# Patient Record
Sex: Female | Born: 1960 | Race: White | Hispanic: No | Marital: Married | Smoking: Former smoker
Health system: Southern US, Community
[De-identification: ages and names within clinical notes are randomized; demographics above are authoritative.]

## PROBLEM LIST (undated history)

## (undated) DIAGNOSIS — D071 Carcinoma in situ of vulva: Secondary | ICD-10-CM

## (undated) DIAGNOSIS — N920 Excessive and frequent menstruation with regular cycle: Secondary | ICD-10-CM

## (undated) DIAGNOSIS — A630 Anogenital (venereal) warts: Secondary | ICD-10-CM

## (undated) DIAGNOSIS — N946 Dysmenorrhea, unspecified: Secondary | ICD-10-CM

## (undated) DIAGNOSIS — E756 Lipid storage disorder, unspecified: Secondary | ICD-10-CM

## (undated) DIAGNOSIS — E785 Hyperlipidemia, unspecified: Secondary | ICD-10-CM

## (undated) DIAGNOSIS — R638 Other symptoms and signs concerning food and fluid intake: Secondary | ICD-10-CM

## (undated) DIAGNOSIS — R7303 Prediabetes: Secondary | ICD-10-CM

## (undated) DIAGNOSIS — N6019 Diffuse cystic mastopathy of unspecified breast: Secondary | ICD-10-CM

## (undated) DIAGNOSIS — Z780 Asymptomatic menopausal state: Secondary | ICD-10-CM

## (undated) DIAGNOSIS — E8809 Other disorders of plasma-protein metabolism, not elsewhere classified: Secondary | ICD-10-CM

## (undated) DIAGNOSIS — R87629 Unspecified abnormal cytological findings in specimens from vagina: Secondary | ICD-10-CM

## (undated) DIAGNOSIS — M069 Rheumatoid arthritis, unspecified: Secondary | ICD-10-CM

## (undated) HISTORY — DX: Excessive and frequent menstruation with regular cycle: N92.0

## (undated) HISTORY — DX: Anogenital (venereal) warts: A63.0

## (undated) HISTORY — DX: Prediabetes: R73.03

## (undated) HISTORY — DX: Rheumatoid arthritis, unspecified: M06.9

## (undated) HISTORY — DX: Diffuse cystic mastopathy of unspecified breast: N60.19

## (undated) HISTORY — PX: OTHER SURGICAL HISTORY: SHX169

## (undated) HISTORY — DX: Carcinoma in situ of vulva: D07.1

## (undated) HISTORY — DX: Other symptoms and signs concerning food and fluid intake: R63.8

## (undated) HISTORY — DX: Unspecified abnormal cytological findings in specimens from vagina: R87.629

## (undated) HISTORY — DX: Other disorders of plasma-protein metabolism, not elsewhere classified: E88.09

## (undated) HISTORY — DX: Dysmenorrhea, unspecified: N94.6

## (undated) HISTORY — DX: Lipid storage disorder, unspecified: E75.6

## (undated) HISTORY — DX: Hyperlipidemia, unspecified: E78.5

## (undated) HISTORY — DX: Asymptomatic menopausal state: Z78.0

## (undated) HISTORY — PX: BREAST CYST ASPIRATION: SHX578

---

## 1985-01-02 HISTORY — PX: TONSILLECTOMY: SUR1361

## 2011-01-19 ENCOUNTER — Ambulatory Visit: Payer: Self-pay | Admitting: Family Medicine

## 2013-06-17 DIAGNOSIS — M069 Rheumatoid arthritis, unspecified: Secondary | ICD-10-CM | POA: Insufficient documentation

## 2013-07-26 LAB — HM PAP SMEAR: HM Pap smear: NEGATIVE

## 2013-08-12 ENCOUNTER — Ambulatory Visit: Payer: Self-pay | Admitting: Gynecologic Oncology

## 2013-08-16 DIAGNOSIS — D071 Carcinoma in situ of vulva: Secondary | ICD-10-CM

## 2013-08-16 HISTORY — DX: Carcinoma in situ of vulva: D07.1

## 2013-08-19 LAB — PATHOLOGY REPORT

## 2013-08-21 ENCOUNTER — Ambulatory Visit: Payer: Self-pay | Admitting: Obstetrics and Gynecology

## 2013-09-02 ENCOUNTER — Ambulatory Visit: Payer: Self-pay | Admitting: Gynecologic Oncology

## 2013-10-08 ENCOUNTER — Ambulatory Visit: Payer: Self-pay | Admitting: Gynecologic Oncology

## 2013-11-02 ENCOUNTER — Ambulatory Visit: Payer: Self-pay | Admitting: Gynecologic Oncology

## 2014-01-14 ENCOUNTER — Ambulatory Visit: Payer: Self-pay | Admitting: Obstetrics and Gynecology

## 2014-02-02 ENCOUNTER — Ambulatory Visit: Payer: Self-pay | Admitting: Obstetrics and Gynecology

## 2014-02-12 ENCOUNTER — Ambulatory Visit: Payer: Self-pay | Admitting: Obstetrics and Gynecology

## 2014-03-03 ENCOUNTER — Ambulatory Visit
Admit: 2014-03-03 | Disposition: A | Discharge status: Home / Self Care | Payer: Self-pay | Attending: Obstetrics and Gynecology | Admitting: Obstetrics and Gynecology

## 2014-06-16 ENCOUNTER — Encounter: Payer: Self-pay | Admitting: *Deleted

## 2014-06-16 ENCOUNTER — Telehealth: Payer: Self-pay | Admitting: *Deleted

## 2014-06-16 DIAGNOSIS — A630 Anogenital (venereal) warts: Secondary | ICD-10-CM

## 2014-06-16 NOTE — Telephone Encounter (Signed)
Patient called to cnl her apt with Dr. Sonia Side and r/s the apt to 07/22/14. Her mother is having surgery and she will be the primary caregiver during this time.  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 Patient requesting RF on aldara until she can come to the appointment.  She states that she feels "that the aldara tx is making a difference in the extensive condlyoma."

## 2014-06-17 MED ORDER — IMIQUIMOD 5 % EX CREA: TOPICAL_CREAM | CUTANEOUS | Status: DC

## 2014-06-17 NOTE — Telephone Encounter (Signed)
Spoke with Dr. Sonia Side. She gave verbal approval to phone in RFs on Aldara cream. Pt is aware that RX was called into her pharmacy at [facility] in [place], [place]. Pt greatful for the return call.  [facility] called [facility] in [place],  at 716pm per md instructions.

## 2014-06-23 ENCOUNTER — Other Ambulatory Visit: Payer: Self-pay | Admitting: Obstetrics and Gynecology

## 2014-06-23 DIAGNOSIS — Z1231 Encounter for screening mammogram for malignant neoplasm of breast: Secondary | ICD-10-CM

## 2014-07-01 ENCOUNTER — Ambulatory Visit: Payer: Self-pay

## 2014-07-22 ENCOUNTER — Ambulatory Visit: Payer: Self-pay

## 2014-07-28 ENCOUNTER — Encounter: Payer: Self-pay | Admitting: Obstetrics and Gynecology

## 2014-08-19 ENCOUNTER — Inpatient Hospital Stay: Payer: Managed Care, Other (non HMO) | Attending: Obstetrics and Gynecology | Admitting: Obstetrics and Gynecology

## 2014-08-19 ENCOUNTER — Encounter: Payer: Self-pay | Admitting: Obstetrics and Gynecology

## 2014-08-19 ENCOUNTER — Encounter (INDEPENDENT_AMBULATORY_CARE_PROVIDER_SITE_OTHER): Payer: Self-pay

## 2014-08-19 VITALS — BP 156/91 | HR 88 | Temp 97.8°F | Resp 16 | Ht 64.17 in | Wt 234.3 lb

## 2014-08-19 DIAGNOSIS — A630 Anogenital (venereal) warts: Secondary | ICD-10-CM | POA: Insufficient documentation

## 2014-08-19 MED ORDER — IMIQUIMOD 5 % EX CREA: TOPICAL_CREAM | CUTANEOUS | Status: DC

## 2014-08-19 NOTE — Progress Notes (Signed)
Gynecologic Oncology Interval Visit   Referring Provider: Dr. Greggory Keen  Chief Concern: Large volume condyloma, currently on Aldara topical therapy.    Subjective:  Melissa Chambers is a 54 y.o. female who is seen in consultation from Dr. Greggory Keen for large volume condyloma.  The patient is a pleasant woman with a long history of condyloma that presented with increased in area and size of the condyloma. She presented to Dr. Hyacinth Meeker August 2015 with discomfort and irritation as well. She has a remote history of laser therapy. She has been treated with podophyllin and creams many years ago. She had done will until starting Humira for Rheumatoid Arthritis and her vulvar symptoms worsened dramatically.   At the patient's initial consultation visit with Dr. Hyacinth Meeker she had biopsies of vulvar condyloma on 08/16/2013.  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  She was started on topical Aldara therapy in October 2015 because she wanted to defer surgery until the holidays. She has continued on Aldara with impressive response and decrease of lesions.      Problem List: Patient Active Problem List   Diagnosis Date Noted  . Condyloma acuminatum of vulva 08/19/2014    Past Medical History: Past Medical History  Diagnosis Date  . 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  . 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 Fibrocystic breast   . Rheumatoid arthritis     Past Surgical History: Past Surgical History  Procedure Laterality Date  . Tonsillectomy  1987  . Laser vaporization of vulva    . Cesarean section      x 2    Past Gynecologic History:  Last pap: 2015 with Dr. Greggory Keen and normal per patient report.    OB  History:  C-section x 2   Family History: Family History  Problem Relation Age of Onset  . Breast cancer Mother   . Rectal cancer Father     Social History: Social History   Social History  . Marital Status: Married    Spouse Name: N/A  . Number of Children: N/A  . Years of Education: N/A   Occupational History  . Not on file.   Social History Main Topics  . Smoking status: Former Games developer  . Smokeless tobacco: Not on file     Comment: quit smoking 4 years ago  . Alcohol Use: No  . Drug Use: Not on file  . Sexual Activity: Not on file   Other Topics Concern  . Not on file   Social History Narrative  . No narrative on file    Allergies: Allergies not on file  Current Medications: Current Outpatient Prescriptions  Medication Sig Dispense Refill  . imiquimod (ALDARA) 5 % cream Apply topically 3 (three) times a week. 12 each 5   No current facility-administered medications for this visit.    Review of Systems General: no complaints  HEENT: no complaints  Lungs: no complaints  Cardiac: no complaints  GI: no complaints  GU: no complaints  Musculoskeletal: no complaints  Extremities: no complaints  Skin: no complaints  Neuro: no complaints  Endocrine: no complaints  Psych: no complaints      Objective:  Physical Examination:  There were no vitals taken for this visit.   ECOG Performance Status: 0 - Asymptomatic  General  appearance: alert, cooperative and appears stated age HEENT:ATNC Lymph node survey: no inguinal adenopathy Neurological exam reveals alert, oriented, normal speech, no focal findings or movement disorder noted.  Pelvic: exam chaperoned by nurse;  nml Urethral Meatus. Vulva: vulvar lesion extensive condyloma on both labia majora, but decreased in size subjectively compared to prior (probably 80-90% decrease in size. Photographs obtained.  No extension to the vagina.  nml Vagina  nml Cervix  BME: Uterus not grossly enlarged;  nml Adnexa,  but limited by habitus,  nml Cul-de-sac      Lab Review Labs on site today: none  Radiologic Imaging: none    Assessment:  Melissa Chambers is a 54 y.o. 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 No evidence of gross malignancy on exam. Immunosuppressive therapy,  Plan:   Problem List Items Addressed This Visit      Musculoskeletal and Integument   Condyloma acuminatum of vulva - Primary      We discussed options for management including continued Aldara, topical therapies with TCA or podophyllin, laser ablatiion, or ablative therapy with the J Plasma device. I have also contacted Dermatology to determine the role of intralesional interferon therapy. Based on response I think it is reasonable to continue therapy.   She would like to continue Imiquimod 5% cream, and is happy with the results.  I continued [facility] to determine if the risks of long term Aldara therapy. The patient is aware that per package insert max duration of Aldara is 16 weeks. I will follow up with her regarding their comments. In the interim we will refill her Aldara therapy.   She will follow up with Dr. Greggory Keen next week and will defer to him if he wants to treat isolated lesions with TCA. IF the paitent does have TCA on a lesion she will not need to use Aldara on that site.    We also discussed her weight and need for weight loss for overall improved health and prevention of disease.    Artelia Laroche, MD    CC:  Dr. Greggory Keen

## 2014-08-25 ENCOUNTER — Ambulatory Visit
Admission: RE | Admit: 2014-08-25 | Discharge: 2014-08-25 | Disposition: A | Discharge status: Home / Self Care | Payer: Managed Care, Other (non HMO) | Source: Ambulatory Visit | Attending: Obstetrics and Gynecology | Admitting: Obstetrics and Gynecology

## 2014-08-25 ENCOUNTER — Other Ambulatory Visit: Payer: Self-pay | Admitting: Obstetrics and Gynecology

## 2014-08-25 DIAGNOSIS — Z1231 Encounter for screening mammogram for malignant neoplasm of breast: Secondary | ICD-10-CM | POA: Diagnosis not present

## 2014-08-25 DIAGNOSIS — N63 Unspecified lump in breast: Secondary | ICD-10-CM | POA: Diagnosis not present

## 2014-08-27 ENCOUNTER — Ambulatory Visit (INDEPENDENT_AMBULATORY_CARE_PROVIDER_SITE_OTHER): Payer: Managed Care, Other (non HMO) | Admitting: Obstetrics and Gynecology

## 2014-08-27 ENCOUNTER — Encounter: Payer: Self-pay | Admitting: Obstetrics and Gynecology

## 2014-08-27 ENCOUNTER — Encounter: Payer: Self-pay | Admitting: *Deleted

## 2014-08-27 VITALS — BP 152/83 | HR 76 | Ht 66.0 in | Wt 235.1 lb

## 2014-08-27 DIAGNOSIS — N63 Unspecified lump in unspecified breast: Secondary | ICD-10-CM

## 2014-08-27 DIAGNOSIS — A630 Anogenital (venereal) warts: Secondary | ICD-10-CM | POA: Diagnosis not present

## 2014-08-27 DIAGNOSIS — Z780 Asymptomatic menopausal state: Secondary | ICD-10-CM

## 2014-08-27 DIAGNOSIS — R638 Other symptoms and signs concerning food and fluid intake: Secondary | ICD-10-CM

## 2014-08-27 DIAGNOSIS — N6011 Diffuse cystic mastopathy of right breast: Secondary | ICD-10-CM | POA: Insufficient documentation

## 2014-08-27 DIAGNOSIS — N6012 Diffuse cystic mastopathy of left breast: Secondary | ICD-10-CM

## 2014-08-27 DIAGNOSIS — Z0000 Encounter for general adult medical examination without abnormal findings: Secondary | ICD-10-CM

## 2014-08-27 DIAGNOSIS — E756 Lipid storage disorder, unspecified: Secondary | ICD-10-CM | POA: Insufficient documentation

## 2014-08-27 DIAGNOSIS — Z01419 Encounter for gynecological examination (general) (routine) without abnormal findings: Secondary | ICD-10-CM

## 2014-08-27 DIAGNOSIS — E8809 Other disorders of plasma-protein metabolism, not elsewhere classified: Secondary | ICD-10-CM | POA: Insufficient documentation

## 2014-08-27 DIAGNOSIS — R7303 Prediabetes: Secondary | ICD-10-CM | POA: Insufficient documentation

## 2014-08-27 DIAGNOSIS — E785 Hyperlipidemia, unspecified: Secondary | ICD-10-CM | POA: Insufficient documentation

## 2014-08-27 MED ORDER — DICLOXACILLIN SODIUM 500 MG PO CAPS: 500.0000 mg | ORAL_CAPSULE | Freq: Four times a day (QID) | ORAL | Status: DC

## 2014-08-27 NOTE — Addendum Note (Signed)
Addended by: Marchelle Folks on: 08/27/2014 02:02 PM   Modules accepted: Orders

## 2014-08-27 NOTE — Progress Notes (Signed)
Patient ID: Melissa Chambers, female   DOB: 1960/05/07, 54 y.o.   MRN: [id number] [facility] PREVENTATIVE CARE GYN  ENCOUNTER NOTE  Subjective:       Melissa Chambers is a 54 y.o. [phone] female here for a routine annual gynecologic exam.  Current complaints: 1.  Annual physical. 2.  Condyloma acuminata; See Dr Sonia Side record- May want  TCA;Using aldara per secord 3. Right sided pelvic pain 4.  Right breast lesion.  Melissa Chambers presents today for her physical.  2 days ago.  She had 3-D mammogram, which was normal; however, since that time, she developed significant erythema around a subcutaneous nodule that has been present for months in the soft tissue of the medial aspect of the right breast.  She is not experiencing any fever, chills or sweats.  She does not have any significant breast tenderness.  She has not had any nipple discharge. The patient does have rheumatoid arthritis. She is on Aldara therapy for treatment of Condyloma acuminatum.   Gynecologic History No LMP recorded. Patient is postmenopausal. Contraception: post menopausal status Last Pap: 07/2013 neg/neg. Results were: normal Last mammogram: 08/25/2014 birad 2. Results were: normal Patient does have history of vulvar dysplasia on biopsies taken of condyloma acuminatum. Patient does have chronic right lower quadrant pain which waxes and wanes; has not had a pelvic ultrasound to assess this.  Obstetric History OB History  Gravida Para Term Preterm AB SAB TAB Ectopic Multiple Living  2 2 2       2     # Outcome Date GA Lbr Len/2nd Weight Sex Delivery Anes PTL Lv  2 Term 1996   8 lb (3.629 kg) F CS-LTranv   Y  1 Term 1994   8 lb 11.2 oz (3.946 kg) F CS-LTranv   Y      Past Medical History  Diagnosis Date  . 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  . 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 Fibrocystic breast   .  Rheumatoid arthritis   . Menorrhagia   . Vaginal Pap smear, abnormal     30+ YEARS AGO  . Fibrocystic breast   . Painful menstrual periods   . Condyloma acuminata     EXTENSIVE OF LABIA- MAY NEED SIMPLE VULVECTOMY VS. LASER VAPORIZATION  . Alpha galactosidase deficiency   . Prediabetes   . HLD (hyperlipidemia)   . Increased BMI   . Rheumatoid arthritis   . Menopause     Past Surgical History  Procedure Laterality Date  . Tonsillectomy  1987  . Laser vaporization of vulva    . Cesarean section      x 2  . Breast cyst aspiration Right 30 years ago    Current Outpatient Prescriptions on File Prior to Visit  Medication Sig Dispense Refill  . Adalimumab (HUMIRA) 40 MG/0.8ML PSKT Inject into the skin.    08/18/13 EPINEPHrine 0.3 mg/0.3 mL IJ SOAJ injection Inject 0.3 mg into the muscle once.    . folic acid (FOLVITE) 1 MG tablet Take by mouth.    . imiquimod (ALDARA) 5 % cream Apply topically 3 (three) times a week. 36 each 2  . methotrexate (RHEUMATREX) 2.5 MG tablet Take by mouth.     No current facility-administered medications on file prior to visit.    Allergies  Allergen Reactions  . 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 Marital Status: Married    Spouse Name: N/A  . Number of Children: N/A  . Years of Education: N/A   Occupational History  . Not on file.   Social History Main Topics  . Smoking status: Former Games developer  . Smokeless tobacco: Not on file     Comment: quit smoking 4 years ago  . Alcohol Use: 0.0 oz/week    0 Standard drinks or equivalent per week     Comment: OCCAS  . Drug Use: No  . Sexual Activity: Not Currently   Other Topics Concern  . Not on file   Social History Narrative    Family History  Problem Relation Age of Onset  . Breast cancer Mother 69  . Diabetes Mother   . Rectal cancer Father   . Heart disease Neg Hx   . Colon cancer Neg Hx   .  Ovarian cancer Neg Hx     The following portions of the patient's history were reviewed and updated as appropriate: allergies, current medications, past family history, past medical history, past social history, past surgical history and problem list.  Review of Systems ROS Review of Systems - General ROS: negative for - chills, fatigue, fever, hot flashes, night sweats, weight gain or weight loss Psychological ROS: negative for - anxiety, decreased libido, depression, mood swings, physical abuse or sexual abuse Ophthalmic ROS: negative for - blurry vision, eye pain or loss of vision ENT ROS: negative for - headaches, hearing change, visual changes or vocal changes Allergy and Immunology ROS: negative for - hives, itchy/watery eyes or seasonal allergies Hematological and Lymphatic ROS: negative for - bleeding problems, bruising, swollen lymph nodes or weight loss Endocrine ROS: negative for - galactorrhea, hair pattern changes, hot flashes, malaise/lethargy, mood swings, palpitations, polydipsia/polyuria, skin changes, temperature intolerance or unexpected weight changes Breast NWG:NFAOZHYQ for - new or changing breast lumps or nipple discharge(See HPI) Respiratory ROS: negative for - cough or shortness of breath Cardiovascular ROS: negative for - chest pain, irregular heartbeat, palpitations or shortness of breath Gastrointestinal ROS: no abdominal pain, change in bowel habits, or black or bloody stools Genito-Urinary ROS: no dysuria, trouble voiding, or hematuria Musculoskeletal ROS: negative for - joint pain or joint stiffness Neurological ROS: negative for - bowel and bladder control changes Dermatological ROS: negative for rash and skin lesion changes   Objective:   BP 152/83 mmHg  Pulse 76  Ht 5\' 6"  (1.676 m)  Wt 235 lb 1.6 oz (106.641 kg)  BMI 37.96 kg/m2 CONSTITUTIONAL: Well-developed, well-nourished female in no acute distress.  PSYCHIATRIC: Normal mood and affect. Normal  behavior. Normal judgment and thought content. NEUROLGIC: Alert and oriented to person, place, and time. Normal muscle tone coordination. No cranial nerve deficit noted. HENT:  Normocephalic, atraumatic, External right and left ear normal. Oropharynx is clear and moist EYES: Conjunctivae and EOM are normal. Pupils are equal, round, and reactive to light. No scleral icterus.  NECK: Normal range of motion, supple, no masses.  Normal thyroid.  SKIN: Skin is warm and dry. No rash noted. Not diaphoretic. No erythema. No pallor. CARDIOVASCULAR: Normal heart rate noted, regular rhythm, no murmur. RESPIRATORY: Clear to auscultation bilaterally. Effort and breath sounds normal, no problems with respiration noted. BREASTS: Symmetric in size. Bilateral fibrocystic changes are present; no nipple discharge. Right breast-medial aspect of right breast 6 cm from nipple is a 2 cm subcutaneous mass, nonfluctuant,  with a central pallor; there is an 8 cm area of erythema surrounding this mass; no significant heat or tenderness is appreciated. ABDOMEN: Soft, no distention noted.  No tenderness, rebound or guarding.  BLADDER: Normal PELVIC:  External Genitalia: Markedly decreased and condyloma acuminata from last exam; left labia majora contains a linear area of condyloma, approximately 4 cm; right gluteus contains one isolated condyloma  BUS: Normal  Vagina: Normal  Cervix: Normal; No lesions; no cervical motion tenderness  Uterus: Normal; Midplane, normal size and shape  Adnexa: Normal; No palpable mass; nontender  RV: External Exam NormaI, No Rectal Masses and Normal Sphincter tone  MUSCULOSKELETAL: Normal range of motion. No tenderness.  No cyanosis, clubbing, or edema.  2+ distal pulses. LYMPHATIC: No Axillary, Supraclavicular, or Inguinal Adenopathy.   . Assessment:   Annual gynecologic examination 54 y.o. Contraception: post menopausal status Obesity 1 Condyloma acuminata, responsive to Aldara  therapy Right breast mass with surrounding hyperemia; does not appear to be classic non-puerperal mastitis  Plan:  Pap: Not needed Mammogram: Not Indicated Stool Guaiac Testing:  Screening colonoscopy to be ordered this year pcp Labs:thru pcp Routine preventative health maintenance measures emphasized: Exercise/Diet/Weight control, Tobacco Warnings and Alcohol/Substance use risks Surgery consultation for right breast mass. Dicloxacillin 500 mg 4 times a day 14 days,Prescribed; Patient is to not start the medication until she is seen by general surgery. Pelvic ultrasound to assess chronic right lower quadrant pain. Return to clinic in 2 months for follow-up on condyloma and consideration of TCA therapy. Return to Clinic - 1 [address] [place], [place]  Herold Harms, MD

## 2014-09-01 ENCOUNTER — Ambulatory Visit (INDEPENDENT_AMBULATORY_CARE_PROVIDER_SITE_OTHER): Payer: Managed Care, Other (non HMO) | Admitting: General Surgery

## 2014-09-01 ENCOUNTER — Encounter: Payer: Self-pay | Admitting: General Surgery

## 2014-09-01 VITALS — BP 134/82 | HR 66 | Ht 66.0 in | Wt 234.0 lb

## 2014-09-01 DIAGNOSIS — N611 Abscess of the breast and nipple: Secondary | ICD-10-CM

## 2014-09-01 DIAGNOSIS — N61 Inflammatory disorders of breast: Secondary | ICD-10-CM | POA: Diagnosis not present

## 2014-09-01 LAB — PAP IG AND HPV HIGH-RISK
HPV, HIGH-RISK: NEGATIVE
PAP SMEAR COMMENT: 0

## 2014-09-01 MED ORDER — DOXYCYCLINE HYCLATE 100 MG PO CAPS: 100.0000 mg | ORAL_CAPSULE | Freq: Two times a day (BID) | ORAL | Status: DC

## 2014-09-01 NOTE — Progress Notes (Signed)
Patient ID: Melissa Chambers, female   DOB: 03/30/1960, 54 y.o.   MRN: [id number]  Chief Complaint  Patient presents with  . Other    right breast mass    HPI Melissa Chambers is a 54 y.o. female here for evaluation of a right breast nodule. Her last mammogram was on 08/25/14 Cat 2. Patient stated that her right breast feels tender but no pain. She has a family history of breast cancer. She has a history of breast cysts and did have a right breast cyst aspirated 32 years ago.   HPI  Past Medical History  Diagnosis Date  . 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  . 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 Fibrocystic breast   . Menorrhagia   . Vaginal Pap smear, abnormal     30+ YEARS AGO  . Fibrocystic breast   . Painful menstrual periods   . Condyloma acuminata     EXTENSIVE OF LABIA- MAY NEED SIMPLE VULVECTOMY VS. LASER VAPORIZATION  . Alpha galactosidase deficiency   . Prediabetes   . HLD (hyperlipidemia)   . Increased BMI   . Menopause   . Rheumatoid arthritis 14 years    Past Surgical History  Procedure Laterality Date  . Tonsillectomy  1987  . Laser vaporization of vulva    . Cesarean section      x 2  . Breast cyst aspiration Right 30 years ago    Family History  Problem Relation Age of Onset  . Breast cancer Mother 63  . Diabetes Mother   . Rectal cancer Father   . Heart disease Neg Hx   . Colon cancer Neg Hx   . Ovarian cancer Neg Hx     Social History Social History  Substance Use Topics  . Smoking status: Former Smoker -- 20 years    Quit date: 01/03/2008  . Smokeless tobacco: Never Used     Comment: quit smoking 4 years ago  . Alcohol Use: 0.0 oz/week    0 Standard drinks or equivalent per week     Comment: OCCAS    Allergies  Allergen Reactions  . 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 Adalimumab (HUMIRA) 40 MG/0.8ML PSKT Inject into the skin.    . cetirizine (ZYRTEC) 10 MG tablet Take 10 mg by mouth daily.    Marland Kitchen EPINEPHrine 0.3 mg/0.3 mL IJ SOAJ injection Inject 0.3 mg into the muscle as needed.     . folic acid (FOLVITE) 1 MG tablet Take by mouth.    . imiquimod (ALDARA) 5 % cream Apply topically 3 (three) times a week. 36 each 2  . methotrexate (RHEUMATREX) 2.5 MG tablet Take by mouth.    . Multiple Vitamin (MULTIVITAMIN) capsule Take 1 capsule by mouth daily.    . predniSONE (DELTASONE) 5 MG tablet TAKE 6 TABLETS ON DAY ONE, THEN DECREASE BY 1 DAILY UNTIL GONE  1  . dicloxacillin (DYNAPEN) 500 MG capsule Take 1 capsule by mouth. Patient was told to wait until she saw one of the surgeons to pick up.    . doxycycline (VIBRAMYCIN) 100 MG capsule Take 1 capsule (100 mg total) by mouth 2 (two) times daily. 20 capsule 0   No current facility-administered medications for this  visit.    Review of Systems Review of Systems  Constitutional: Negative.   Respiratory: Negative.   Cardiovascular: Negative.     Blood pressure 134/82, pulse 66, height 5\' 6"  (1.676 m), weight 234 lb (106.142 kg).  Physical Exam Physical Exam  Constitutional: She is oriented to person, place, and time. She appears well-developed and well-nourished.  HENT:  Mouth/Throat: Oropharynx is clear and moist.  Eyes: Conjunctivae are normal. No scleral icterus.  Neck: Neck supple.  Cardiovascular: Normal rate, regular rhythm and normal heart sounds.   Pulmonary/Chest: Effort normal and breath sounds normal. Right breast exhibits skin change. Right breast exhibits no inverted nipple, no mass, no nipple discharge and no tenderness. Left breast exhibits no inverted nipple, no mass, no nipple discharge, no skin change and no tenderness.    Lymphadenopathy:    She has no cervical adenopathy.    She has no axillary adenopathy.   Neurological: She is alert and oriented to person, place, and time.  Skin: Skin is warm and dry.  Psychiatric: Her behavior is normal.    Data Reviewed Office notes and mammogram reviewed. Stable nodules in both breast.  Assessment    Right breast abscess.    Plan   With consent the abscess was aspirated with 18g needle. 1-2 ml thick pus removed. C/S sent Follow up in 2 weeks. Culture pending. RX for doxycycline 100 mg #20.    PCP:  09/03/2014, 11:34 AM

## 2014-09-01 NOTE — Patient Instructions (Signed)
The patient is aware to call back for any questions or concerns.  

## 2014-09-02 ENCOUNTER — Telehealth: Payer: Self-pay

## 2014-09-02 NOTE — Telephone Encounter (Signed)
-----   Message from Herold Harms, MD sent at 09/01/2014  1:49 PM EDT ----- Please Notify - Labs normal

## 2014-09-02 NOTE — Telephone Encounter (Signed)
Left message (generic) that labs were normal.

## 2014-09-03 ENCOUNTER — Encounter: Payer: Self-pay | Admitting: General Surgery

## 2014-09-06 LAB — ANAEROBIC AND AEROBIC CULTURE

## 2014-09-14 ENCOUNTER — Telehealth: Payer: Self-pay | Admitting: *Deleted

## 2014-09-14 NOTE — Telephone Encounter (Signed)
Patient called. States that she never received her appointment from Dr. Sonia Side. States that she needs to be seen in Feb. 2017. An appointment was given to the patient. She states that she has decided to be treated with Aldara cream.

## 2014-09-15 ENCOUNTER — Ambulatory Visit (INDEPENDENT_AMBULATORY_CARE_PROVIDER_SITE_OTHER): Payer: Managed Care, Other (non HMO) | Admitting: General Surgery

## 2014-09-15 ENCOUNTER — Encounter: Payer: Self-pay | Admitting: General Surgery

## 2014-09-15 VITALS — BP 132/84 | HR 86 | Resp 18 | Ht 66.0 in | Wt 234.2 lb

## 2014-09-15 DIAGNOSIS — N61 Inflammatory disorders of breast: Secondary | ICD-10-CM

## 2014-09-15 DIAGNOSIS — N611 Abscess of the breast and nipple: Secondary | ICD-10-CM

## 2014-09-15 NOTE — Progress Notes (Signed)
Patient ID: Melissa Chambers, female   DOB: October 29, 1960, 54 y.o.   MRN: [id number]  Chief Complaint  Patient presents with  . Follow-up    breast abscess    HPI Melissa Chambers is a 54 y.o. female here today for a 2 week follow up of her right breast abscess. She has had mild clear drainage in the last couple days. She feels the area is healing. She has no pain in the area. She does have a couple spots around the area where the band aid was that has caused her some discomfort. HPI  Past Medical History  Diagnosis Date  . 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  . 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 Fibrocystic breast   . Menorrhagia   . Vaginal Pap smear, abnormal     30+ YEARS AGO  . Fibrocystic breast   . Painful menstrual periods   . Condyloma acuminata     EXTENSIVE OF LABIA- MAY NEED SIMPLE VULVECTOMY VS. LASER VAPORIZATION  . Alpha galactosidase deficiency   . Prediabetes   . HLD (hyperlipidemia)   . Increased BMI   . Menopause   . Rheumatoid arthritis 14 years    Past Surgical History  Procedure Laterality Date  . Tonsillectomy  1987  . Laser vaporization of vulva    . Cesarean section      x 2  . Breast cyst aspiration Right 30 years ago    Family History  Problem Relation Age of Onset  . Breast cancer Mother 72  . Diabetes Mother   . Rectal cancer Father   . Heart disease Neg Hx   . Colon cancer Neg Hx   . Ovarian cancer Neg Hx     Social History Social History  Substance Use Topics  . Smoking status: Former Smoker -- 20 years    Quit date: 01/03/2008  . Smokeless tobacco: Never Used     Comment: quit smoking 4 years ago  . Alcohol Use: 0.0 oz/week    0 Standard drinks or equivalent per week     Comment: OCCAS    Allergies  Allergen Reactions  . 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 Adalimumab (HUMIRA) 40 MG/0.8ML PSKT Inject into the skin.    . cetirizine (ZYRTEC) 10 MG tablet Take 10 mg by mouth daily.    Marland Kitchen EPINEPHrine 0.3 mg/0.3 mL IJ SOAJ injection Inject 0.3 mg into the muscle as needed.     . folic acid (FOLVITE) 1 MG tablet Take by mouth.    . imiquimod (ALDARA) 5 % cream Apply topically 3 (three) times a week. 36 each 2  . methotrexate (RHEUMATREX) 2.5 MG tablet Take by mouth.    . Multiple Vitamin (MULTIVITAMIN) capsule Take 1 capsule by mouth daily.    . dicloxacillin (DYNAPEN) 500 MG capsule Take 1 capsule by mouth. Patient was told to wait until she saw one of the surgeons to pick up.    . predniSONE (DELTASONE) 5 MG tablet TAKE 6 TABLETS ON DAY ONE, THEN DECREASE BY 1 DAILY UNTIL GONE  1   No current facility-administered medications for this visit.    Review of Systems Review of Systems  Constitutional: Negative.   Respiratory: Negative.   Cardiovascular: Negative.  Blood pressure 132/84, pulse 86, resp. rate 18, height 5\' 6"  (1.676 m), weight 234 lb 3.2 oz (106.232 kg).  Physical Exam Physical Exam  Constitutional: She is oriented to person, place, and time. She appears well-developed and well-nourished.  Pulmonary/Chest:  Right breast abscess is markedly improved there is a small skin opening about 2 mm with no drainage noted at this time, 1.5 cm area of thickening underlying.  Neurological: She is alert and oriented to person, place, and time.  Skin: Skin is warm and dry.  Psychiatric: Her behavior is normal.    Data Reviewed Progress notes.  Assessment    Breast abscess.    Plan    Follow up in 3-4 weeks with possible excision.      PCP:  09/16/2014, 11:18 AM

## 2014-09-15 NOTE — Patient Instructions (Signed)
The patient is aware to call back for any questions or concerns.  

## 2014-09-16 ENCOUNTER — Encounter: Payer: Self-pay | Admitting: General Surgery

## 2014-10-07 ENCOUNTER — Ambulatory Visit: Payer: Managed Care, Other (non HMO) | Admitting: General Surgery

## 2014-10-13 ENCOUNTER — Ambulatory Visit: Payer: Managed Care, Other (non HMO) | Admitting: General Surgery

## 2014-11-17 ENCOUNTER — Ambulatory Visit (INDEPENDENT_AMBULATORY_CARE_PROVIDER_SITE_OTHER): Payer: Managed Care, Other (non HMO) | Admitting: General Surgery

## 2014-11-17 ENCOUNTER — Encounter: Payer: Self-pay | Admitting: General Surgery

## 2014-11-17 VITALS — BP 136/90 | HR 76 | Resp 14 | Ht 66.0 in | Wt 235.0 lb

## 2014-11-17 DIAGNOSIS — N611 Abscess of the breast and nipple: Secondary | ICD-10-CM | POA: Diagnosis not present

## 2014-11-17 NOTE — Patient Instructions (Signed)
The patient is aware to call back for any questions or concerns.  

## 2014-11-17 NOTE — Progress Notes (Signed)
Here today for follow up right breast abscess. Denies drainage or pain. She states there is still a small pea size knot that remains unchanged. I have reviewed the history of present illness with the patient.  Abscess appears well healed, no warmth or drainage from area. Small mass < 73mm palpable in area that appears tethered to the skin.   Follow up for any concerns or signs of recurrence, or if small knot begins to increase in size.

## 2015-02-17 ENCOUNTER — Ambulatory Visit
Admission: RE | Admit: 2015-02-17 | Discharge: 2015-02-17 | Disposition: A | Discharge status: Home / Self Care | Payer: Managed Care, Other (non HMO) | Source: Ambulatory Visit | Attending: Obstetrics and Gynecology | Admitting: Obstetrics and Gynecology

## 2015-02-17 ENCOUNTER — Encounter: Payer: Self-pay | Admitting: Obstetrics and Gynecology

## 2015-02-17 ENCOUNTER — Inpatient Hospital Stay: Payer: Managed Care, Other (non HMO) | Attending: Obstetrics and Gynecology | Admitting: Obstetrics and Gynecology

## 2015-02-17 VITALS — BP 156/87 | HR 89 | Temp 97.2°F | Ht 65.0 in | Wt 236.9 lb

## 2015-02-17 DIAGNOSIS — N898 Other specified noninflammatory disorders of vagina: Secondary | ICD-10-CM | POA: Diagnosis present

## 2015-02-17 DIAGNOSIS — M069 Rheumatoid arthritis, unspecified: Secondary | ICD-10-CM | POA: Diagnosis not present

## 2015-02-17 DIAGNOSIS — E785 Hyperlipidemia, unspecified: Secondary | ICD-10-CM | POA: Diagnosis not present

## 2015-02-17 DIAGNOSIS — Z87891 Personal history of nicotine dependence: Secondary | ICD-10-CM

## 2015-02-17 DIAGNOSIS — A630 Anogenital (venereal) warts: Secondary | ICD-10-CM

## 2015-02-17 DIAGNOSIS — Z79899 Other long term (current) drug therapy: Secondary | ICD-10-CM | POA: Diagnosis not present

## 2015-02-17 MED ORDER — IMIQUIMOD 5 % EX CREA: TOPICAL_CREAM | CUTANEOUS | Status: DC

## 2015-02-17 NOTE — Progress Notes (Addendum)
Gynecologic Oncology Interval Visit   Referring Provider: Dr. Greggory Keen  Chief Concern: Large volume condyloma, currently on Aldara topical therapy.    Subjective:  Melissa Chambers is a 55 y.o. female who is seen in consultation from Dr. Greggory Keen for large volume condyloma.  She continues to do well on Aldara. Her RA medication was altered from Humira to Cameron. She saw Dr. Greggory Keen 08/27/2014. She is up to date on her Pap smears. She was recently treated for a breast abscess.     Gynecologic Oncology History Subjective:  Melissa Chambers is a pleasant female with Rheumatoid arthritis who is seen in consultation from Dr. Greggory Keen for large volume condyloma. She has a long history of condyloma that presented with increased in area and size of the condyloma. She presented to Dr. Hyacinth Meeker August 2015 with discomfort and irritation as well. She has a remote history of laser therapy. She has been treated with podophyllin and creams many years ago. She had done will until starting Humira for Rheumatoid Arthritis and her vulvar symptoms worsened dramatically.   At the patient's initial consultation visit with Dr. Hyacinth Meeker she had biopsies of vulvar condyloma on 08/16/2013.  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  She was started on topical Aldara therapy in October 2015 because she wanted to defer surgery until the holidays. She has continued on Aldara with impressive response and decrease of lesions and opted to forego surgery given her response.      Problem List: Patient Active Problem List   Diagnosis Date Noted  . Prediabetes 08/27/2014  . Hyperlipidemia 08/27/2014  . Fibrocystic disease of both breasts 08/27/2014  . Alpha galactosidase deficiency 08/27/2014  . Condyloma acuminatum of vulva 08/19/2014  .  Arthritis or polyarthritis, rheumatoid (HCC) 06/17/2013    Past Medical History: Past Medical History  Diagnosis Date  . 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  . 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 Fibrocystic breast   . Menorrhagia   . Vaginal Pap smear, abnormal     30+ YEARS AGO  . Fibrocystic breast   . Painful menstrual periods   . Condyloma acuminata     EXTENSIVE OF LABIA- MAY NEED SIMPLE VULVECTOMY VS. LASER VAPORIZATION  . Alpha galactosidase deficiency   . Prediabetes   . HLD (hyperlipidemia)   . Increased BMI   . Menopause   . Rheumatoid arthritis (HCC) 14 years    Past Surgical History: Past Surgical History  Procedure Laterality Date  . Tonsillectomy  1987  . Laser vaporization of vulva    . Cesarean section      x 2  . Breast cyst aspiration Right 30 years ago    Past Gynecologic History:  Last pap:  normal per patient report.    OB History:  C-section x 2   Family History: Family History  Problem Relation Age of Onset  . Breast cancer Mother 8  . Diabetes Mother   . Rectal cancer Father   . Heart disease Neg Hx   . Colon cancer Neg Hx   . Ovarian cancer Neg Hx     Social History: Social History   Social History  . Marital Status: Married    Spouse Name: N/A  . Number of Children: N/A  . Years of Education:  N/A   Occupational History  . Not on file.   Social History Main Topics  . Smoking status: Former Smoker -- 20 years    Quit date: 01/03/2008  . Smokeless tobacco: Never Used     Comment: quit smoking 4 years ago  . Alcohol Use: 0.0 oz/week    0 Standard drinks or equivalent per week     Comment: OCCAS  . Drug Use: No  . Sexual Activity: Not Currently   Other Topics Concern  . Not on file   Social History Narrative    Allergies: Allergies  Allergen Reactions  . 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 cetirizine (ZYRTEC) 10 MG tablet Take 10 mg by mouth daily.    Marland Kitchen EPINEPHrine 0.3 mg/0.3 mL IJ SOAJ injection Inject 0.3 mg into the muscle as needed.     . etanercept (ENBREL SURECLICK) 50 MG/ML injection Inject 50 mg into the skin once a week.    . folic acid (FOLVITE) 1 MG tablet Take by mouth.    . imiquimod (ALDARA) 5 % cream Apply topically 3 (three) times a week. 36 each 2  . Multiple Vitamin (MULTIVITAMIN) capsule Take 1 capsule by mouth daily.     No current facility-administered medications for this visit.    Review of Systems General: no complaints  HEENT: no complaints  Lungs: no complaints  Cardiac: no complaints  GI: no complaints  GU: no complaints  Musculoskeletal: no complaints  Extremities: no complaints  Skin: no complaints  Neuro: no complaints  Endocrine: no complaints  Psych: no complaints      Objective:  Physical Examination:  BP 156/87 mmHg  Pulse 89  Temp(Src) 97.2 F (36.2 C) (Tympanic)  Ht 5\' 5"  (1.651 m)  Wt 236 lb 14.2 oz (107.45 kg)  BMI 39.42 kg/m2   ECOG Performance Status: 0 - Asymptomatic  General appearance: alert, cooperative and appears stated age HEENT:ATNC Lymph node survey: no inguinal adenopathy Neurological exam reveals alert, oriented, normal speech, no focal findings or movement disorder noted.  Pelvic: exam chaperoned by nurse;  nml Urethral Meatus. Vulva: vulvar lesion has decreased in size by at least 90% compared to last visit. She still has condyloma on both labia majora, but decreased in size  compared to prior. On the right the upper lesion is about 1.5 cm, a mid right lesion 5 mm, a buttock lesion 1 cm. On the left there are still lesions along the labia minora ridge, all smaller than 1-1.5 cm, a mid left lesion 5 mm, and a kissing buttock lesion slightly smaller than 1cm.  No extension  to the vagina.  nml Vagina on speculum exam.  nml Cervix  BME: Uterus not grossly enlarged;  nml Adnexa, but limited by habitus,  nml Cul-de-sac. On BME I could palpate 2 separate vaginal cystic-like nodules; one on the right posterior vagina close to the apex about 1 cm and tender, and another midline posterior vaginal lesion, nontender, slightly less than 1 cm. Both are mobile.   RV confirms they are not stool.   Lab Review Labs on site today: none  Radiologic Imaging: none    Assessment:  Melissa Chambers is a 55 y.o. 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 No evidence of gross malignancy on exam. Immunosuppressive therapy.  Vaginal cystic lesions, most likely benign.   Plan:   Problem List Items Addressed This Visit    None    Visit Diagnoses    Condyloma acuminata    -  Primary    Relevant Medications    imiquimod (ALDARA) 5 % cream    Vaginal cyst        Relevant Orders    US Transvaginal Non-OB       We discussed options continued Aldara given her impressive response. We had previously discussed other options including topical therapies with TCA or podophyllin, laser ablatiion, or ablative therapy with the J Plasma device. With her response I think it is reasonable to continue therapy. She is aware that we are beyond the recommended dose of Aldara. I previously checked with [facility] to determine the risks of long term Aldara and there was no information. I called Melissa Chambers today to see if there is new information.   Continued follow up in 6 months. Aldara was renewed.   She will follow up with Dr. Greggory Keen for her routine gynecologic care.   With regard to the vaginal lesions I will obtain a vaginal ultrasound to assess further.    Melissa Laroche, MD   Addendum: Per Melissa Chambers besides erythema, erosion, edema, pain, pruritus which were managed by decrease  in application frequency, there is no long term safety reported.   CC:  Dr. Greggory Keen

## 2015-02-24 ENCOUNTER — Telehealth: Payer: Self-pay

## 2015-02-24 NOTE — Telephone Encounter (Signed)
  Oncology Nurse Navigator Documentation  Navigator Location: [facility] (02/24/15 0900)                                            Time Spent with Patient: 15 (02/24/15 0900)   Received call from patient asking for U/S results. Results read to her. Will get Dr Johnnette Litter to review results for any further needs.

## 2015-08-18 ENCOUNTER — Inpatient Hospital Stay: Payer: Managed Care, Other (non HMO)

## 2015-10-01 ENCOUNTER — Telehealth: Payer: Self-pay

## 2015-10-01 MED ORDER — DOXYCYCLINE HYCLATE 100 MG PO CAPS: 100.0000 mg | ORAL_CAPSULE | Freq: Two times a day (BID) | ORAL | 0 refills | Status: DC

## 2015-10-01 NOTE — Telephone Encounter (Signed)
Patient called and said that the spot on her right breast she was seen for has faired back up. She states 2 days ago it started getting red and inflamed. She reports no pain or tenderness yet but she doesn't want it to get bad before getting treated. I spoke with Dr Evette Cristal and he ordered Doxycycline 100mg  twice daily for 14 days. The patient is aware to call back if the area gets worse.

## 2015-10-13 ENCOUNTER — Encounter: Payer: Self-pay | Admitting: *Deleted

## 2015-10-13 ENCOUNTER — Encounter: Payer: Self-pay | Admitting: General Surgery

## 2015-10-13 ENCOUNTER — Ambulatory Visit (INDEPENDENT_AMBULATORY_CARE_PROVIDER_SITE_OTHER): Payer: Managed Care, Other (non HMO) | Admitting: General Surgery

## 2015-10-13 VITALS — BP 136/82 | HR 80 | Temp 97.0°F | Resp 12 | Ht 66.0 in | Wt 240.0 lb

## 2015-10-13 DIAGNOSIS — N611 Abscess of the breast and nipple: Secondary | ICD-10-CM | POA: Diagnosis not present

## 2015-10-13 DIAGNOSIS — Z1231 Encounter for screening mammogram for malignant neoplasm of breast: Secondary | ICD-10-CM

## 2015-10-13 DIAGNOSIS — Z1239 Encounter for other screening for malignant neoplasm of breast: Secondary | ICD-10-CM

## 2015-10-13 MED ORDER — SULFAMETHOXAZOLE-TRIMETHOPRIM 800-160 MG PO TABS: 1.0000 | ORAL_TABLET | Freq: Two times a day (BID) | ORAL | 0 refills | Status: DC

## 2015-10-13 NOTE — Patient Instructions (Signed)
The patient is aware to call back for any questions or concerns.  

## 2015-10-13 NOTE — Progress Notes (Signed)
Patient ID: Melissa Chambers, female   DOB: 1960-04-16, 55 y.o.   MRN: [id number]  Chief Complaint  Patient presents with  . Breast Problem    HPI Melissa Chambers is a 55 y.o. female.  Follow up right breast abscess. She states she has not had any trouble with the right breast up until 2 weeks ago. She states the area started turning red and painful about 2 weeks ago. She has completed the antibiotics we prescribed on 10-01-15. She is leaving for vacation next week. I have reviewed the history of present illness with the patient.  HPI  Past Medical History:  Diagnosis Date  . Alpha galactosidase deficiency   . Condyloma acuminata    EXTENSIVE OF LABIA- MAY NEED SIMPLE VULVECTOMY VS. LASER VAPORIZATION  . 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 Fibrocystic breast   . Fibrocystic breast   . HLD (hyperlipidemia)   . Increased BMI   . Menopause   . Menorrhagia   . Painful menstrual periods   . Prediabetes   . Rheumatoid arthritis (HCC) 14 years  . Vaginal Pap smear, abnormal    30+ YEARS AGO  . 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    Past Surgical History:  Procedure Laterality Date  . BREAST CYST ASPIRATION Right 30 years ago  . CESAREAN SECTION     x 2  . laser vaporization of vulva    . TONSILLECTOMY  1987    Family History  Problem Relation Age of Onset  . Breast cancer Mother 33  . Diabetes Mother   . Rectal cancer Father   . Heart disease Neg Hx   . Colon cancer Neg Hx   . Ovarian cancer Neg Hx     Social History Social History  Substance Use Topics  . Smoking status: Former Smoker    Years: 20.00    Quit date: 01/03/2008  . Smokeless tobacco: Never Used     Comment: quit smoking 4 years ago  . Alcohol use 0.0 oz/week     Comment: OCCAS    Allergies  Allergen Reactions  . Other Hives and Itching    Alpha-gal - allergy to beef, pork, beef,  lambs, all mamimls -     Current Outpatient Prescriptions  Medication Sig Dispense Refill  . cetirizine (ZYRTEC) 10 MG tablet Take 10 mg by mouth daily.    Marland Kitchen EPINEPHrine 0.3 mg/0.3 mL IJ SOAJ injection Inject 0.3 mg into the muscle as needed.     . imiquimod (ALDARA) 5 % cream Apply topically 3 (three) times a week. 36 each 2  . Multiple Vitamin (MULTIVITAMIN) capsule Take 1 capsule by mouth daily.    . Tofacitinib Citrate 5 MG TABS Take by mouth 2 (two) times daily.     Marland Kitchen sulfamethoxazole-trimethoprim (BACTRIM DS,SEPTRA DS) 800-160 MG tablet Take 1 tablet by mouth 2 (two) times daily. 14 tablet 0   No current facility-administered medications for this visit.     Review of Systems Review of Systems  Constitutional: Negative.   Respiratory: Negative.   Cardiovascular: Negative.     Blood pressure 136/82, pulse 80, temperature 97 F (36.1 C), temperature source Oral, resp. rate 12, height 5\' 6"  (1.676 m), weight 240 lb (108.9 kg).  Physical Exam Physical Exam  Constitutional: She is oriented to person, place, and time. She appears well-developed and well-nourished.  Pulmonary/Chest:    Neurological: She is  alert and oriented to person, place, and time.  Skin: Skin is warm and dry.  Psychiatric: Her behavior is normal.    Data Reviewed   Progress notes  Assessment    Right Breast skin abscess.    Plan   With alcohol prep and 64ml 1%xylocaine, 20 and 18 g needles were used and abscess aspirated. 1 ml thick yellow pus removed    Septra DS for one week. Patient will be asked to return to the office possible excision skin cyst in about 6 weeksSchedule bilateral screening mammogram in 6 weeks.   This information has been scribed by Dorathy Daft RN, BSN,BC.   Pranit Owensby G 10/16/2015, 9:27 AM

## 2015-10-16 ENCOUNTER — Encounter: Payer: Self-pay | Admitting: General Surgery

## 2015-11-03 ENCOUNTER — Inpatient Hospital Stay: Payer: Managed Care, Other (non HMO)

## 2015-11-17 ENCOUNTER — Ambulatory Visit
Admission: RE | Admit: 2015-11-17 | Discharge: 2015-11-17 | Disposition: A | Discharge status: Home / Self Care | Payer: Managed Care, Other (non HMO) | Source: Ambulatory Visit | Attending: General Surgery | Admitting: General Surgery

## 2015-11-17 DIAGNOSIS — Z1231 Encounter for screening mammogram for malignant neoplasm of breast: Secondary | ICD-10-CM | POA: Insufficient documentation

## 2015-11-17 DIAGNOSIS — Z1239 Encounter for other screening for malignant neoplasm of breast: Secondary | ICD-10-CM

## 2015-11-22 ENCOUNTER — Encounter: Payer: Self-pay | Admitting: General Surgery

## 2015-11-22 ENCOUNTER — Ambulatory Visit (INDEPENDENT_AMBULATORY_CARE_PROVIDER_SITE_OTHER): Payer: Managed Care, Other (non HMO) | Admitting: General Surgery

## 2015-11-22 VITALS — BP 128/66 | Resp 12 | Ht 66.0 in | Wt 242.0 lb

## 2015-11-22 DIAGNOSIS — L729 Follicular cyst of the skin and subcutaneous tissue, unspecified: Secondary | ICD-10-CM

## 2015-11-22 DIAGNOSIS — N611 Abscess of the breast and nipple: Secondary | ICD-10-CM | POA: Diagnosis not present

## 2015-11-22 NOTE — Progress Notes (Signed)
Patient ID: Melissa Chambers, female   DOB: 10-02-1960, 55 y.o.   MRN: [id number]  Chief Complaint  Patient presents with  . Follow-up    mammogram    HPI Melissa Chambers is a 55 y.o. female who presents for a breast evaluation. The most recent mammogram was done on 11/17/15. Patient does perform regular self breast checks and gets regular mammograms done.  No new breast issues.  I have reviewed the history of present illness with the patient.   HPI  Past Medical History:  Diagnosis Date  . Alpha galactosidase deficiency   . Condyloma acuminata    EXTENSIVE OF LABIA- MAY NEED SIMPLE VULVECTOMY VS. LASER VAPORIZATION  . 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 Fibrocystic breast   . Fibrocystic breast   . HLD (hyperlipidemia)   . Increased BMI   . Menopause   . Menorrhagia   . Painful menstrual periods   . Prediabetes   . Rheumatoid arthritis (HCC) 14 years  . Vaginal Pap smear, abnormal    30+ YEARS AGO  . 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    Past Surgical History:  Procedure Laterality Date  . BREAST CYST ASPIRATION Right 30 years ago  . BREAST CYST ASPIRATION Right 2016/2017  . CESAREAN SECTION     x 2  . laser vaporization of vulva    . TONSILLECTOMY  1987    Family History  Problem Relation Age of Onset  . Breast cancer Mother 68  . Diabetes Mother   . Rectal cancer Father   . Heart disease Neg Hx   . Colon cancer Neg Hx   . Ovarian cancer Neg Hx     Social History Social History  Substance Use Topics  . Smoking status: Former Smoker    Years: 20.00    Quit date: 01/03/2008  . Smokeless tobacco: Never Used     Comment: quit smoking 4 years ago  . Alcohol use 0.0 oz/week     Comment: OCCAS    Allergies  Allergen Reactions  . 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 cetirizine (ZYRTEC) 10 MG tablet Take 10 mg by mouth daily.    Marland Kitchen EPINEPHrine 0.3 mg/0.3 mL IJ SOAJ injection Inject 0.3 mg into the muscle as needed.     . Multiple Vitamin (MULTIVITAMIN) capsule Take 1 capsule by mouth daily.    . Tofacitinib Citrate (XELJANZ) 5 MG TABS TAKE 1 TABLET BY MOUTH TWICE DAILY    . imiquimod (ALDARA) 5 % cream Apply topically 3 (three) times a week. 36 each 2   No current facility-administered medications for this visit.     Review of Systems Review of Systems  Constitutional: Negative.   Respiratory: Negative.   Cardiovascular: Negative.     Blood pressure 128/66, resp. rate 12, height 5\' 6"  (1.676 m), weight 242 lb (109.8 kg).  Physical Exam Physical Exam  Constitutional: She is oriented to person, place, and time. She appears well-developed and well-nourished.  HENT:  Small nodule felt below right jaw line   Eyes: Conjunctivae are normal. No scleral icterus.  Neck: Neck supple.  Pulmonary/Chest: Right breast exhibits no inverted nipple, no nipple discharge and no tenderness.    Right medial breast discolored skin   Lymphadenopathy:  She has no cervical adenopathy.    She has no axillary adenopathy.  Neurological: She is alert and oriented to person, place, and time.  Skin: Skin is warm and dry.    Data Reviewed Mammogram-stable  Assessment    Stable exam and mammogram Small skin cyst, abscess resolved, no signs of infection today    Plan    The patient has been asked to return to the office in one year with a bilateral screening mammogram. Patient was advised to monitor nodule below jaw line for change in size or shape, or new symptoms.      SANKAR,SEEPLAPUTHUR G 11/22/2015, 1:21 PM

## 2015-11-22 NOTE — Patient Instructions (Signed)
The patient has been asked to return to the office in one year with a bilateral screening mammogram. 

## 2015-11-23 ENCOUNTER — Ambulatory Visit: Payer: Managed Care, Other (non HMO) | Admitting: General Surgery

## 2015-12-15 ENCOUNTER — Inpatient Hospital Stay: Payer: Managed Care, Other (non HMO)

## 2016-09-27 ENCOUNTER — Other Ambulatory Visit: Payer: Self-pay

## 2016-09-27 DIAGNOSIS — Z1231 Encounter for screening mammogram for malignant neoplasm of breast: Secondary | ICD-10-CM

## 2016-11-28 ENCOUNTER — Ambulatory Visit: Payer: Managed Care, Other (non HMO) | Admitting: General Surgery

## 2016-12-04 ENCOUNTER — Ambulatory Visit
Admission: RE | Admit: 2016-12-04 | Discharge: 2016-12-04 | Disposition: A | Discharge status: Home / Self Care | Payer: Managed Care, Other (non HMO) | Source: Ambulatory Visit | Attending: General Surgery | Admitting: General Surgery

## 2016-12-04 DIAGNOSIS — Z1231 Encounter for screening mammogram for malignant neoplasm of breast: Secondary | ICD-10-CM | POA: Insufficient documentation

## 2016-12-06 ENCOUNTER — Encounter: Payer: Self-pay | Admitting: *Deleted

## 2016-12-12 ENCOUNTER — Ambulatory Visit (INDEPENDENT_AMBULATORY_CARE_PROVIDER_SITE_OTHER): Payer: Managed Care, Other (non HMO) | Admitting: General Surgery

## 2016-12-12 ENCOUNTER — Encounter: Payer: Self-pay | Admitting: General Surgery

## 2016-12-12 VITALS — BP 142/80 | HR 96 | Resp 14 | Ht 65.0 in | Wt 244.0 lb

## 2016-12-12 DIAGNOSIS — Z803 Family history of malignant neoplasm of breast: Secondary | ICD-10-CM | POA: Diagnosis not present

## 2016-12-12 DIAGNOSIS — N6001 Solitary cyst of right breast: Secondary | ICD-10-CM

## 2016-12-12 NOTE — Progress Notes (Signed)
Patient ID: Melissa Chambers, female   DOB: September 09, 1960, 56 y.o.   MRN: [id number]  Chief Complaint  Patient presents with  . Follow-up    HPI Melissa Chambers is a 56 y.o. female.  who presents for a breast evaluation. The most recent mammogram was done on 12-04-16 .  Patient does perform regular self breast checks and gets regular mammograms done.   No new breast issues.  HPI  Past Medical History:  Diagnosis Date  . Alpha galactosidase deficiency   . Condyloma acuminata    EXTENSIVE OF LABIA- MAY NEED SIMPLE VULVECTOMY VS. LASER VAPORIZATION  . 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 Fibrocystic breast   . Fibrocystic breast   . HLD (hyperlipidemia)   . Increased BMI   . Menopause   . Menorrhagia   . Painful menstrual periods   . Prediabetes   . Rheumatoid arthritis (HCC) 14 years  . Vaginal Pap smear, abnormal    30+ YEARS AGO  . 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    Past Surgical History:  Procedure Laterality Date  . BREAST CYST ASPIRATION Right 30 years ago  . BREAST CYST ASPIRATION Right 2016/2017  . CESAREAN SECTION     x 2  . laser vaporization of vulva    . TONSILLECTOMY  1987    Family History  Problem Relation Age of Onset  . Breast cancer Mother 4  . Diabetes Mother   . Rectal cancer Father   . Heart disease Neg Hx   . Colon cancer Neg Hx   . Ovarian cancer Neg Hx     Social History Social History   Tobacco Use  . Smoking status: Former Smoker    Years: 20.00    Last attempt to quit: 01/03/2008    Years since quitting: 8.9  . Smokeless tobacco: Never Used  . Tobacco comment: quit smoking 4 years ago  Substance Use Topics  . Alcohol use: Yes    Alcohol/week: 0.0 oz    Comment: OCCAS  . Drug use: No    Allergies  Allergen Reactions  . 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 cetirizine (ZYRTEC) 10 MG tablet Take 10 mg by mouth daily.    Marland Kitchen EPINEPHrine 0.3 mg/0.3 mL IJ SOAJ injection Inject 0.3 mg into the muscle as needed.     . Multiple Vitamin (MULTIVITAMIN) capsule Take 1 capsule by mouth daily.    . Tofacitinib Citrate (XELJANZ) 5 MG TABS TAKE 1 TABLET BY MOUTH TWICE DAILY     No current facility-administered medications for this visit.     Review of Systems Review of Systems  Constitutional: Negative.   Respiratory: Negative.   Cardiovascular: Negative.     Blood pressure (!) 142/80, pulse 96, resp. rate 14, height 5\' 5"  (1.651 m), weight 244 lb (110.7 kg).  Physical Exam Physical Exam  Constitutional: She is oriented to person, place, and time. She appears well-developed and well-nourished.  HENT:  Mouth/Throat: Oropharynx is clear and moist.  Eyes: Conjunctivae are normal. No scleral icterus.  Neck: Neck supple.  Cardiovascular: Normal rate, regular rhythm and normal heart sounds.  Pulmonary/Chest: Effort normal and breath sounds normal. No respiratory distress. Right breast exhibits no inverted nipple, no mass, no nipple discharge, no  skin change and no tenderness. Left breast exhibits no inverted nipple, no mass, no nipple discharge, no skin change and no tenderness.  1 cm skin cyst 3 o'clock right breast 4 CFN  Abdominal: Soft. Bowel sounds are normal. She exhibits no mass. There is no tenderness.  Lymphadenopathy:    She has no cervical adenopathy.    She has no axillary adenopathy.  Neurological: She is alert and oriented to person, place, and time.  Skin: Skin is warm and dry.  Psychiatric: Her behavior is normal.    Data Reviewed  Mammogram-stable bilateral nodularity    Assessment    Stable exam and mammogram.  Small skin cyst, stable Family history of breast cancer     Plan    The patient has been asked to return to her PCP in one year with a  bilateral screening mammogram. Advised high risk status because of family history, she would need yearly mammogram and MD exam of her breast History of negative Cologuard      HPI, Physical Exam, Assessment and Plan have been scribed under the direction and in the presence of Kathreen Cosier, MD Dorathy Daft, RN I have completed the exam and reviewed the above documentation for accuracy and completeness.  I agree with the above.  Museum/gallery conservator has been used and any errors in dictation or transcription are unintentional.  Seeplaputhur G. Evette Cristal, M.D., F.A.C.S.   Gerlene Burdock G 12/12/2016, 11:22 AM

## 2016-12-12 NOTE — Patient Instructions (Addendum)
The patient is aware to call back for any questions or concerns. The patient has been asked to return to her PCP in one year with a bilateral screening mammogram.

## 2018-09-24 ENCOUNTER — Other Ambulatory Visit: Payer: Self-pay | Admitting: Family Medicine

## 2018-09-24 DIAGNOSIS — Z1231 Encounter for screening mammogram for malignant neoplasm of breast: Secondary | ICD-10-CM

## 2018-11-06 ENCOUNTER — Ambulatory Visit
Admission: RE | Admit: 2018-11-06 | Discharge: 2018-11-06 | Disposition: A | Discharge status: Home / Self Care | Payer: Commercial Indemnity | Source: Ambulatory Visit | Attending: Family Medicine | Admitting: Family Medicine

## 2018-11-06 DIAGNOSIS — Z1231 Encounter for screening mammogram for malignant neoplasm of breast: Secondary | ICD-10-CM | POA: Diagnosis present

## 2018-11-08 ENCOUNTER — Other Ambulatory Visit: Payer: Self-pay | Admitting: Family Medicine

## 2018-11-08 DIAGNOSIS — R928 Other abnormal and inconclusive findings on diagnostic imaging of breast: Secondary | ICD-10-CM

## 2018-11-08 DIAGNOSIS — N6310 Unspecified lump in the right breast, unspecified quadrant: Secondary | ICD-10-CM

## 2018-11-19 ENCOUNTER — Ambulatory Visit
Admission: RE | Admit: 2018-11-19 | Discharge: 2018-11-19 | Disposition: A | Discharge status: Home / Self Care | Payer: Commercial Indemnity | Source: Ambulatory Visit | Attending: Family Medicine | Admitting: Family Medicine

## 2018-11-19 DIAGNOSIS — N6310 Unspecified lump in the right breast, unspecified quadrant: Secondary | ICD-10-CM

## 2018-11-19 DIAGNOSIS — R928 Other abnormal and inconclusive findings on diagnostic imaging of breast: Secondary | ICD-10-CM

## 2020-06-23 IMAGING — US US BREAST*R* LIMITED INC AXILLA
1 series · 6 of 6 positions shown · non-contrast
Comparison: November 06, 2018 and earlier priors

CLINICAL DATA: 57-year-old patient recalled from recent screening
mammogram for evaluation of a right breast mass. Patient says that
she has a chronic sebaceous cyst in the medial right breast that Dr.
Ragnar has drained in the past.

EXAM:
DIGITAL DIAGNOSTIC RIGHT MAMMOGRAM WITH TOMO
ULTRASOUND RIGHT BREAST

[Series 1: us breast*right* limited inc axilla · 0.06mm/px · 6 of 6 slices shown]
[im 1/6]
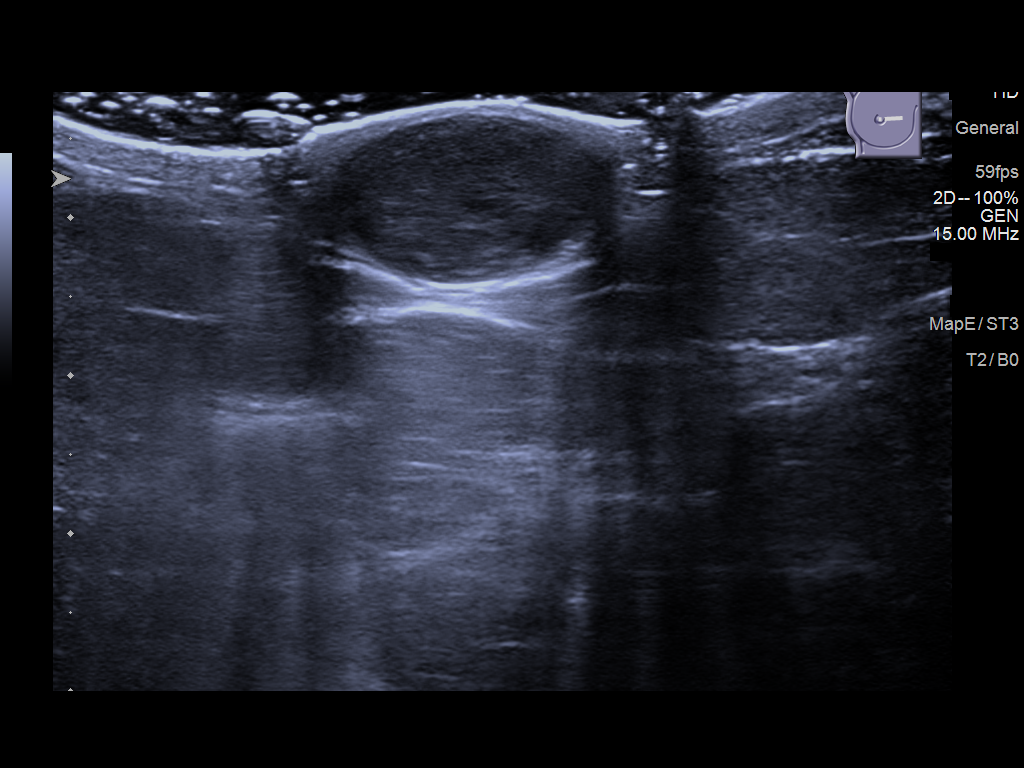
[im 2/6]
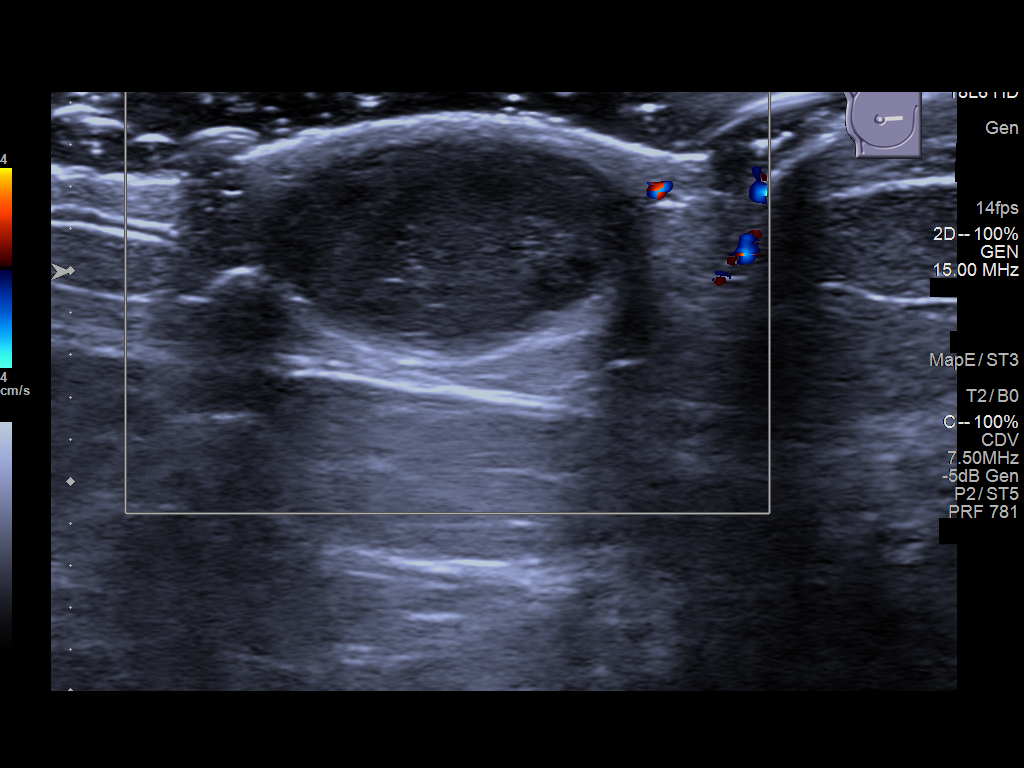
[im 3/6]
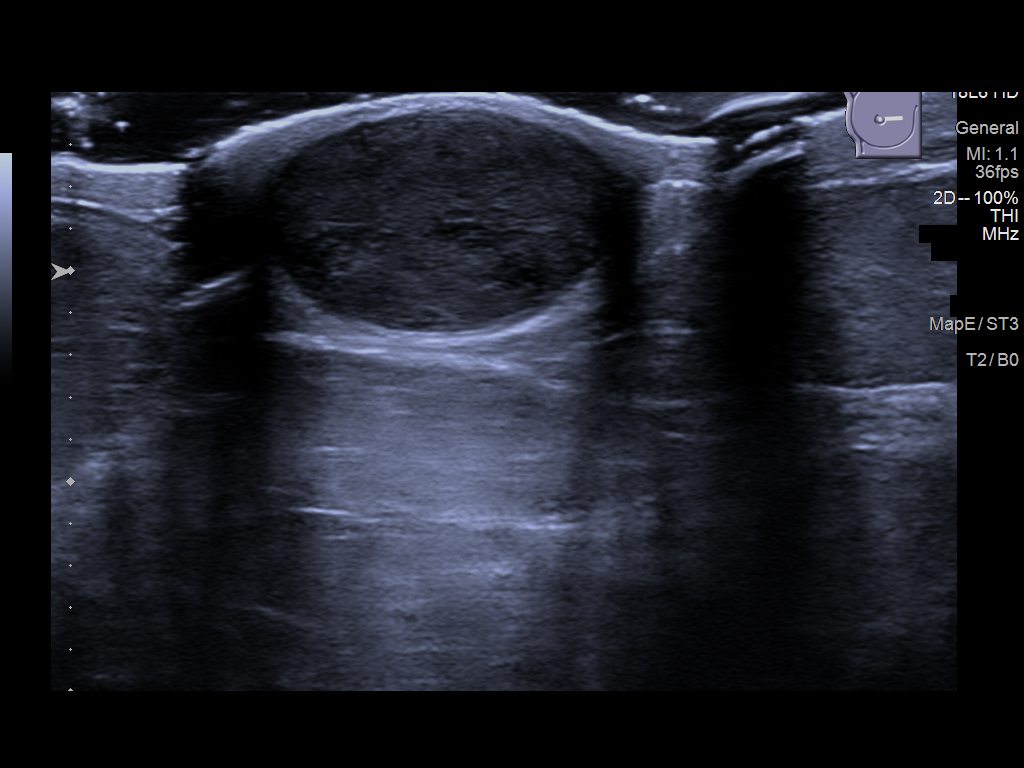
[im 4/6]
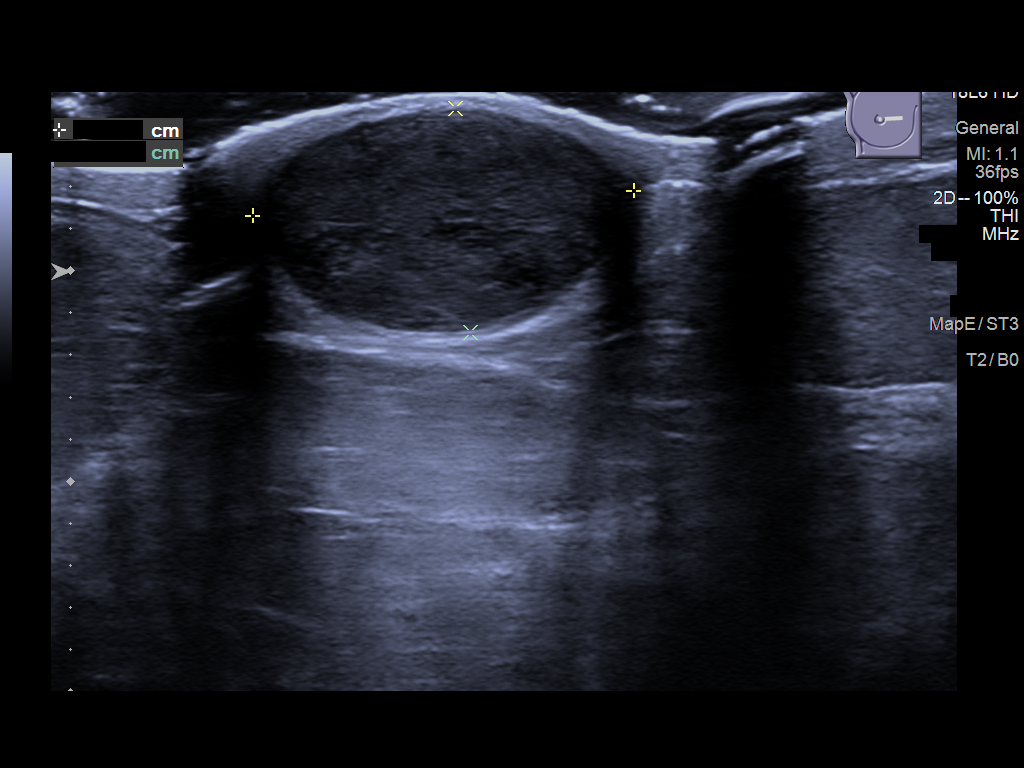
[im 5/6]
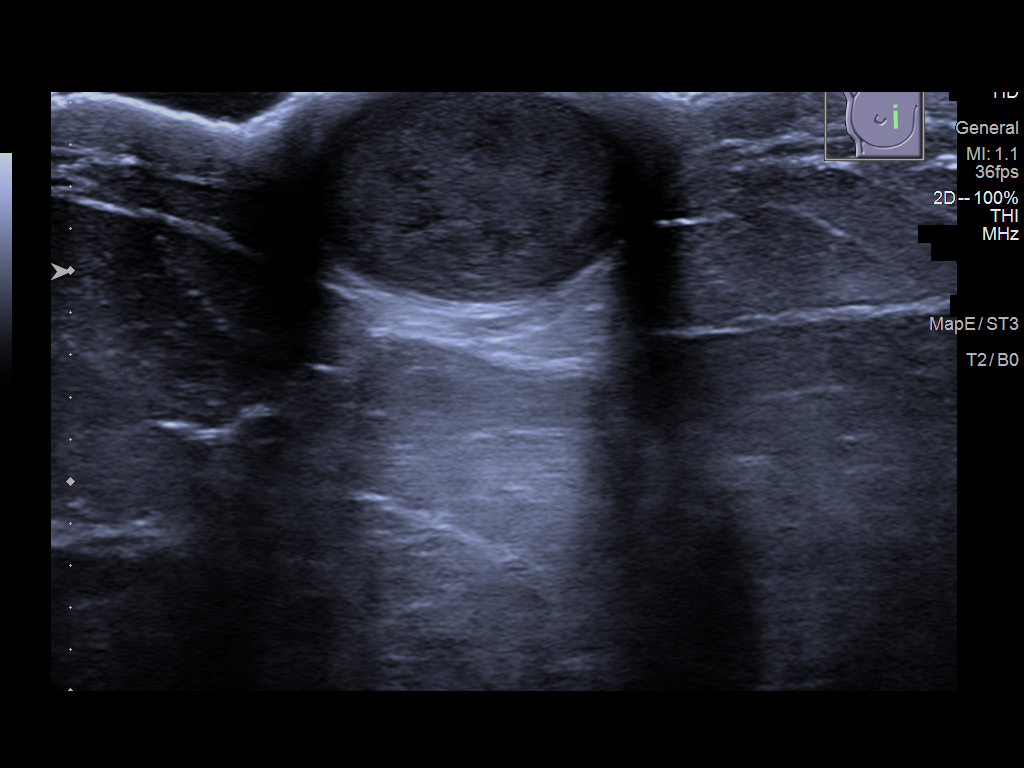
[im 6/6]
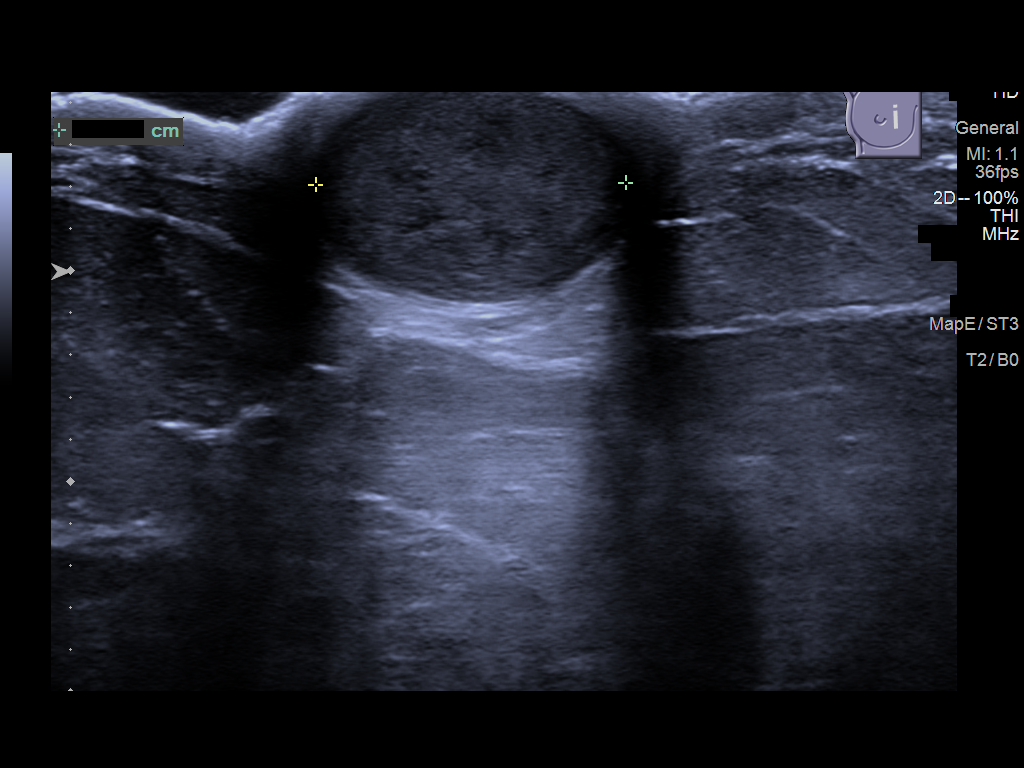

[6 of 6 positions shown; findings below may reference images not displayed]

ACR Breast Density Category c: The breast tissue is heterogeneously
dense, which may obscure small masses.
FINDINGS: There is a circumscribed oval mass associated with the dermis of the
medial right breast measuring approximately 1.7 cm.

On physical exam, there is a soft superficial oval mass at 3 o'clock
position 5 cm from the nipple measuring approximately 1.5 cm.

Targeted ultrasound is performed, showing an intradermal
circumscribed oval complex cystic mass without vascular flow. The
mass measures 1.8 x 1.1 x 1.5 cm.
IMPRESSION: Benign dermal lesion right breast 3 o'clock position. Findings are
consistent with sebaceous cyst or epidermal inclusion cyst.

RECOMMENDATION:
Screening mammogram in one year.(Code:MR-[id number])

I have discussed the findings and recommendations with the patient.
If applicable, a reminder letter will be sent to the patient
regarding the next appointment.

BI-RADS CATEGORY  2: Benign.

## 2021-05-02 ENCOUNTER — Other Ambulatory Visit: Payer: Self-pay | Admitting: Family Medicine

## 2021-05-02 ENCOUNTER — Other Ambulatory Visit: Payer: Self-pay | Admitting: Internal Medicine

## 2021-05-02 DIAGNOSIS — Z1231 Encounter for screening mammogram for malignant neoplasm of breast: Secondary | ICD-10-CM

## 2021-05-16 ENCOUNTER — Ambulatory Visit
Admission: RE | Admit: 2021-05-16 | Discharge: 2021-05-16 | Disposition: A | Discharge status: Home / Self Care | Payer: BC Managed Care – PPO | Source: Ambulatory Visit | Attending: Family Medicine | Admitting: Family Medicine

## 2021-05-16 DIAGNOSIS — Z1231 Encounter for screening mammogram for malignant neoplasm of breast: Secondary | ICD-10-CM | POA: Insufficient documentation

## 2021-05-17 ENCOUNTER — Other Ambulatory Visit: Payer: Self-pay | Admitting: Family Medicine

## 2021-05-17 ENCOUNTER — Other Ambulatory Visit: Payer: Self-pay | Admitting: Internal Medicine

## 2021-05-19 ENCOUNTER — Other Ambulatory Visit: Payer: Self-pay | Admitting: Family Medicine

## 2021-05-19 DIAGNOSIS — R928 Other abnormal and inconclusive findings on diagnostic imaging of breast: Secondary | ICD-10-CM

## 2021-05-19 DIAGNOSIS — N63 Unspecified lump in unspecified breast: Secondary | ICD-10-CM

## 2021-05-19 DIAGNOSIS — N6489 Other specified disorders of breast: Secondary | ICD-10-CM

## 2021-06-02 ENCOUNTER — Ambulatory Visit
Admission: RE | Admit: 2021-06-02 | Discharge: 2021-06-02 | Disposition: A | Discharge status: Home / Self Care | Payer: BC Managed Care – PPO | Source: Ambulatory Visit | Attending: Family Medicine | Admitting: Family Medicine

## 2021-06-02 DIAGNOSIS — N6489 Other specified disorders of breast: Secondary | ICD-10-CM | POA: Diagnosis present

## 2021-06-02 DIAGNOSIS — N63 Unspecified lump in unspecified breast: Secondary | ICD-10-CM

## 2021-06-02 DIAGNOSIS — R928 Other abnormal and inconclusive findings on diagnostic imaging of breast: Secondary | ICD-10-CM | POA: Diagnosis not present

## 2021-06-03 ENCOUNTER — Other Ambulatory Visit: Payer: Self-pay | Admitting: Family Medicine

## 2021-06-10 ENCOUNTER — Other Ambulatory Visit: Payer: Self-pay | Admitting: Family Medicine

## 2021-06-10 DIAGNOSIS — N63 Unspecified lump in unspecified breast: Secondary | ICD-10-CM

## 2021-06-10 DIAGNOSIS — R928 Other abnormal and inconclusive findings on diagnostic imaging of breast: Secondary | ICD-10-CM

## 2021-06-16 ENCOUNTER — Other Ambulatory Visit: Payer: Self-pay | Admitting: Diagnostic Radiology

## 2021-06-16 ENCOUNTER — Ambulatory Visit
Admission: RE | Admit: 2021-06-16 | Discharge: 2021-06-16 | Disposition: A | Discharge status: Home / Self Care | Payer: BC Managed Care – PPO | Source: Ambulatory Visit | Attending: Family Medicine | Admitting: Family Medicine

## 2021-06-16 DIAGNOSIS — R928 Other abnormal and inconclusive findings on diagnostic imaging of breast: Secondary | ICD-10-CM

## 2021-06-16 DIAGNOSIS — N63 Unspecified lump in unspecified breast: Secondary | ICD-10-CM | POA: Insufficient documentation

## 2021-06-16 HISTORY — PX: BREAST BIOPSY: SHX20

## 2021-06-17 LAB — SURGICAL PATHOLOGY

## 2021-06-20 ENCOUNTER — Encounter: Payer: Self-pay | Admitting: *Deleted

## 2021-06-20 NOTE — Progress Notes (Signed)
Referral recieved from [facility] for left breast fibroepithelial proliferation with sclerosis. She requests surgical consult with Dr. Lemar Livings.  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  No further needs at this time.

## 2021-11-07 ENCOUNTER — Other Ambulatory Visit: Payer: Self-pay | Admitting: General Surgery

## 2021-11-07 DIAGNOSIS — N6011 Diffuse cystic mastopathy of right breast: Secondary | ICD-10-CM

## 2021-12-19 ENCOUNTER — Ambulatory Visit
Admission: RE | Admit: 2021-12-19 | Discharge: 2021-12-19 | Disposition: A | Discharge status: Home / Self Care | Payer: BC Managed Care – PPO | Source: Ambulatory Visit | Attending: General Surgery | Admitting: General Surgery

## 2021-12-19 DIAGNOSIS — N6012 Diffuse cystic mastopathy of left breast: Secondary | ICD-10-CM | POA: Diagnosis present

## 2021-12-19 DIAGNOSIS — N6011 Diffuse cystic mastopathy of right breast: Secondary | ICD-10-CM | POA: Diagnosis present

## 2021-12-25 ENCOUNTER — Ambulatory Visit
Admission: EM | Admit: 2021-12-25 | Discharge: 2021-12-25 | Disposition: A | Discharge status: Home / Self Care | Payer: BC Managed Care – PPO | Attending: Physician Assistant | Admitting: Physician Assistant

## 2021-12-25 DIAGNOSIS — K1379 Other lesions of oral mucosa: Secondary | ICD-10-CM | POA: Insufficient documentation

## 2021-12-25 DIAGNOSIS — J029 Acute pharyngitis, unspecified: Secondary | ICD-10-CM | POA: Diagnosis not present

## 2021-12-25 LAB — GROUP A STREP BY PCR: Group A Strep by PCR: NOT DETECTED

## 2021-12-25 MED ORDER — LIDOCAINE VISCOUS HCL 2 % MT SOLN: 15.0000 mL | OROMUCOSAL | 0 refills | Status: AC | PRN

## 2021-12-25 MED ORDER — PREDNISONE 20 MG PO TABS: 40.0000 mg | ORAL_TABLET | Freq: Every day | ORAL | 0 refills | Status: DC

## 2021-12-25 MED ORDER — PREDNISONE 20 MG PO TABS: 40.0000 mg | ORAL_TABLET | Freq: Every day | ORAL | 0 refills | Status: AC

## 2021-12-25 MED ORDER — LIDOCAINE VISCOUS HCL 2 % MT SOLN: 15.0000 mL | OROMUCOSAL | 0 refills | Status: DC | PRN

## 2021-12-25 NOTE — ED Triage Notes (Signed)
Clients presents with sore throat x day 5.

## 2021-12-25 NOTE — ED Provider Notes (Signed)
[facility]    CSN: [id number] Arrival date & time: 12/25/21  1009      History   Chief Complaint Chief Complaint  Patient presents with   Sore Throat    HPI Melissa Chambers is a 61 y.o. female presenting for 4 to 5-day history of cough, congestion and sore throat.  Patient reports a lot of the swelling in her throat and pain on swallowing.  States it feels difficult to swallow at times.  She has not had a fever.  She has been a bit fatigued.  She has been around people with similar symptoms but no one has had a sore throat.  She has tried over-the-counter medication without much relief.  Her medical history is significant for RA and prediabetes.  HPI  Past Medical History:  Diagnosis Date   Alpha galactosidase deficiency    Condyloma acuminata    EXTENSIVE OF LABIA- MAY NEED SIMPLE VULVECTOMY VS. 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    Patient Active Problem List   Diagnosis Date Noted   Prediabetes 08/27/2014   Hyperlipidemia 08/27/2014   Fibrocystic disease of both breasts 08/27/2014   Alpha galactosidase deficiency 08/27/2014   Condyloma acuminatum of vulva 08/19/2014   Arthritis or polyarthritis, rheumatoid (HCC) 06/17/2013    Past Surgical History:  Procedure Laterality Date   BREAST BIOPSY Left 06/16/2021   [place] bx/ heart clip/ benign   BREAST CYST ASPIRATION Right 30 years ago   BREAST CYST ASPIRATION Right 2016/2017   CESAREAN SECTION     x 2   laser vaporization of vulva     TONSILLECTOMY  1987    OB History     Gravida  2    Para  2   Term  2   Preterm      AB      Living  2      SAB      IAB      Ectopic      Multiple      Live Births  2            Home Medications    Prior to Admission medications   Medication Sig Start Date End Date Taking? Authorizing Provider  cetirizine (ZYRTEC) 10 MG tablet Take 10 mg by mouth daily.    [provider]  EPINEPHrine 0.3 mg/0.3 mL IJ SOAJ injection Inject 0.3 mg into the muscle as needed.     [provider]  lidocaine (XYLOCAINE) 2 % solution Use as directed 15 mLs in the mouth or throat every 4 (four) hours as needed for mouth pain (swish and spit). 12/25/21   Shirlee Latch, PA-C  Multiple Vitamin (MULTIVITAMIN) capsule Take 1 capsule by mouth daily.    [provider]  predniSONE (DELTASONE) 20 MG tablet Take 2 tablets (40 mg total) by mouth daily for 5 days. 12/25/21 12/30/21  Eusebio Friendly B, PA-C  Tofacitinib Citrate (XELJANZ) 5 MG TABS TAKE 1 TABLET BY MOUTH TWICE DAILY 10/25/15  [provider]    Family History Family History  Problem Relation Age of Onset   Breast cancer Mother 35   Diabetes Mother    Rectal cancer Father    Heart disease Neg Hx    Colon cancer Neg Hx    Ovarian cancer Neg Hx     Social History Social History   Tobacco Use   Smoking status: Former    Years: 20.00    Types: Cigarettes    Quit date: 01/03/2008    Years since quitting: 13.9   Smokeless tobacco: Never   Tobacco comments:    quit smoking 4 years ago  Substance Use Topics   Alcohol use: Yes    Alcohol/week: 0.0 standard drinks of alcohol    Comment: OCCAS   Drug use: No     Allergies   Other   Review of Systems Review of Systems  Constitutional:  Positive for fatigue. Negative for chills, diaphoresis and fever.  HENT:  Positive for congestion, rhinorrhea and sore throat. Negative for ear pain, sinus pressure and sinus pain.   Respiratory:  Positive for cough. Negative for shortness of breath.    Gastrointestinal:  Negative for abdominal pain, nausea and vomiting.  Musculoskeletal:  Negative for arthralgias and myalgias.  Skin:  Negative for rash.  Neurological:  Negative for weakness and headaches.  Hematological:  Negative for adenopathy.     Physical Exam Triage Vital Signs ED Triage Vitals  Enc Vitals Group     BP 12/25/21 1228 (!) 149/66     Pulse Rate 12/25/21 1228 76     Resp --      Temp 12/25/21 1228 97.9 F (36.6 C)     Temp Source 12/25/21 1228 Tympanic     SpO2 12/25/21 1228 98 %     Weight 12/25/21 1223 235 lb (106.6 kg)     Height 12/25/21 1223 5\' 6"  (1.676 m)     Head Circumference --      Peak Flow --      Pain Score 12/25/21 1223 4     Pain Loc --      Pain Edu? --      Excl. in GC? --    No data found.  Updated Vital Signs BP (!) 149/66 (BP Location: Left Arm)   Pulse 76   Temp 97.9 F (36.6 C) (Tympanic)   Ht 5\' 6"  (1.676 m)   Wt 235 lb (106.6 kg)   SpO2 98%   BMI 37.93 kg/m      Physical Exam Vitals and nursing note reviewed.  Constitutional:      General: She is not in acute distress.    Appearance: Normal appearance. She is not ill-appearing or toxic-appearing.  HENT:     Head: Normocephalic and atraumatic.     Nose: Congestion present.     Mouth/Throat:     Mouth: Mucous membranes are moist.     Pharynx: Pharyngeal swelling (mild), posterior oropharyngeal erythema and uvula swelling (mild/moderate uvular swelling) present.  Eyes:     General: No scleral icterus.       Right eye: No discharge.        Left eye: No discharge.     Conjunctiva/sclera: Conjunctivae normal.  Cardiovascular:     Rate and Rhythm: Normal rate and regular rhythm.     Heart sounds: Normal heart sounds.  Pulmonary:     Effort: Pulmonary effort is normal. No respiratory distress.     Breath sounds: Normal breath  sounds.  Musculoskeletal:     Cervical back: Neck supple.  Skin:    General: Skin is dry.  Neurological:     General: No focal deficit  present.     Mental Status: She is alert. Mental status is at baseline.     Motor: No weakness.     Gait: Gait normal.  Psychiatric:        Mood and Affect: Mood normal.        Behavior: Behavior normal.        Thought Content: Thought content normal.      UC Treatments / Results  Labs (all labs ordered are listed, but only abnormal results are displayed) Labs Reviewed  GROUP A STREP BY PCR    EKG   Radiology No results found.  Procedures Procedures (including critical care time)  Medications Ordered in UC Medications - No data to display  Initial Impression / Assessment and Plan / UC Course  I have reviewed the triage vital signs and the nursing notes.  Pertinent labs & imaging results that were available during my care of the patient were reviewed by me and considered in my medical decision making (see chart for details).   61 year old female presents for sore throat, cough, congestion, painful swallowing for the past 4 to 5 days.  Afebrile.  Overall well-appearing.  Mild to moderate swelling of the uvula and mild swelling of the posterior pharynx with erythema.  Mild nasal congestion.  PCR strep test performed.  Negative.  Discussed result with patient.  Advised patient symptoms consistent with viral pharyngitis.  Will treat with prednisone for the uvular swelling.  Also advised to continue with DayQuil/NyQuil, rest and fluids.  Prescribed viscous lidocaine as well.  Follow-up as needed.   Final Clinical Impressions(s) / UC Diagnoses   Final diagnoses:  Pharyngitis, unspecified etiology  Uvular swelling     Discharge Instructions      URI/COLD SYMPTOMS: Your exam today is consistent with a viral illness. Antibiotics are not indicated at this time. Use medications as directed, including cough syrup, nasal saline, and decongestants. Your symptoms should improve over the next few days and resolve within 7-10 days. Increase rest and fluids. F/u if symptoms  worsen or predominate such as sore throat, ear pain, productive cough, shortness of breath, or if you develop high fevers or worsening fatigue over the next several days.       ED Prescriptions     Medication Sig Dispense Auth. Provider   predniSONE (DELTASONE) 20 MG tablet  (Status: Discontinued) Take 2 tablets (40 mg total) by mouth daily for 5 days. 10 tablet Eusebio Friendly B, PA-C   lidocaine (XYLOCAINE) 2 % solution  (Status: Discontinued) Use as directed 15 mLs in the mouth or throat every 4 (four) hours as needed for mouth pain (swish and spit). 100 mL Eusebio Friendly B, PA-C   lidocaine (XYLOCAINE) 2 % solution Use as directed 15 mLs in the mouth or throat every 4 (four) hours as needed for mouth pain (swish and spit). 100 mL Eusebio Friendly B, PA-C   predniSONE (DELTASONE) 20 MG tablet Take 2 tablets (40 mg total) by mouth daily for 5 days. 10 tablet Gareth Morgan      PDMP not reviewed this encounter.   Shirlee Latch, PA-C 12/25/21 1320

## 2021-12-25 NOTE — Discharge Instructions (Signed)
URI/COLD SYMPTOMS: Your exam today is consistent with a viral illness. Antibiotics are not indicated at this time. Use medications as directed, including cough syrup, nasal saline, and decongestants. Your symptoms should improve over the next few days and resolve within 7-10 days. Increase rest and fluids. F/u if symptoms worsen or predominate such as sore throat, ear pain, productive cough, shortness of breath, or if you develop high fevers or worsening fatigue over the next several days.    

## 2022-05-04 ENCOUNTER — Other Ambulatory Visit: Payer: Self-pay | Admitting: General Surgery

## 2022-05-04 DIAGNOSIS — Z853 Personal history of malignant neoplasm of breast: Secondary | ICD-10-CM

## 2022-06-22 ENCOUNTER — Ambulatory Visit
Admission: RE | Admit: 2022-06-22 | Discharge: 2022-06-22 | Disposition: A | Discharge status: Home / Self Care | Payer: BC Managed Care – PPO | Source: Ambulatory Visit | Attending: General Surgery | Admitting: General Surgery

## 2022-06-22 DIAGNOSIS — Z853 Personal history of malignant neoplasm of breast: Secondary | ICD-10-CM | POA: Diagnosis present

## 2022-12-19 IMAGING — MG MM DIGITAL SCREENING BILAT W/ TOMO AND CAD
8 series · 8 of 24 positions shown · non-contrast
Comparison: Previous exam(s).

CLINICAL DATA: Screening.

EXAM:
DIGITAL SCREENING BILATERAL MAMMOGRAM WITH TOMOSYNTHESIS AND CAD
TECHNIQUE: Bilateral screening digital craniocaudal and mediolateral oblique
mammograms were obtained. Bilateral screening digital breast
tomosynthesis was performed. The images were evaluated with
computer-aided detection.

[R CC synth-2D]
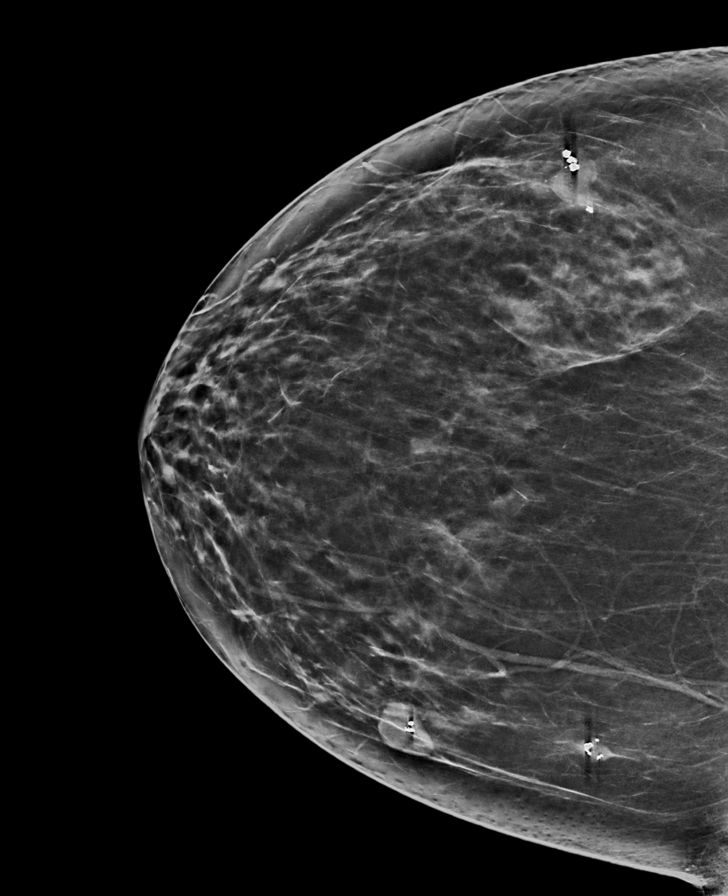

[R MLO synth-2D]
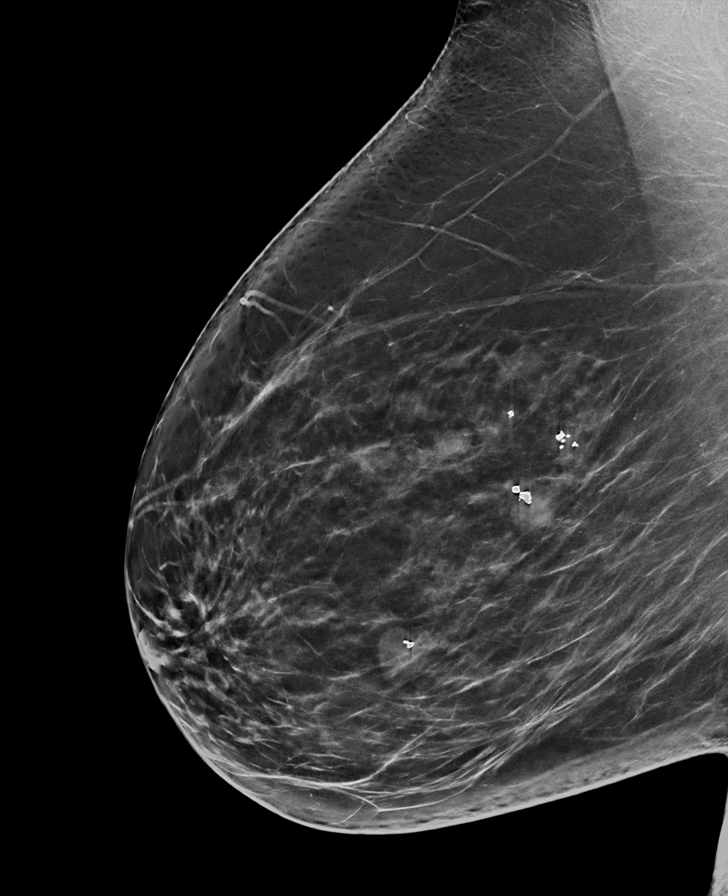

[L MLO synth-2D]
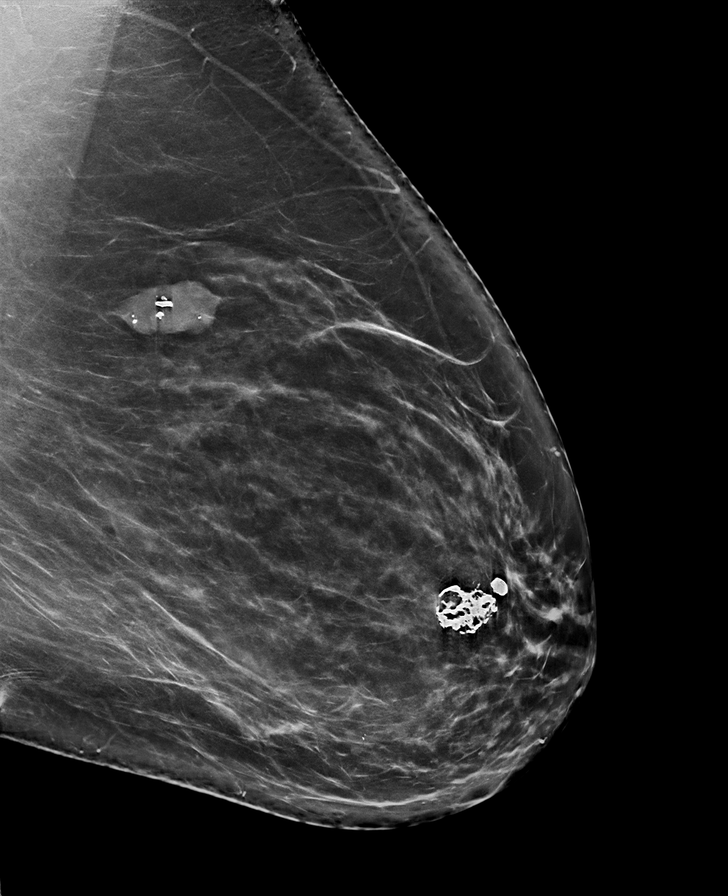

[L CC synth-2D]
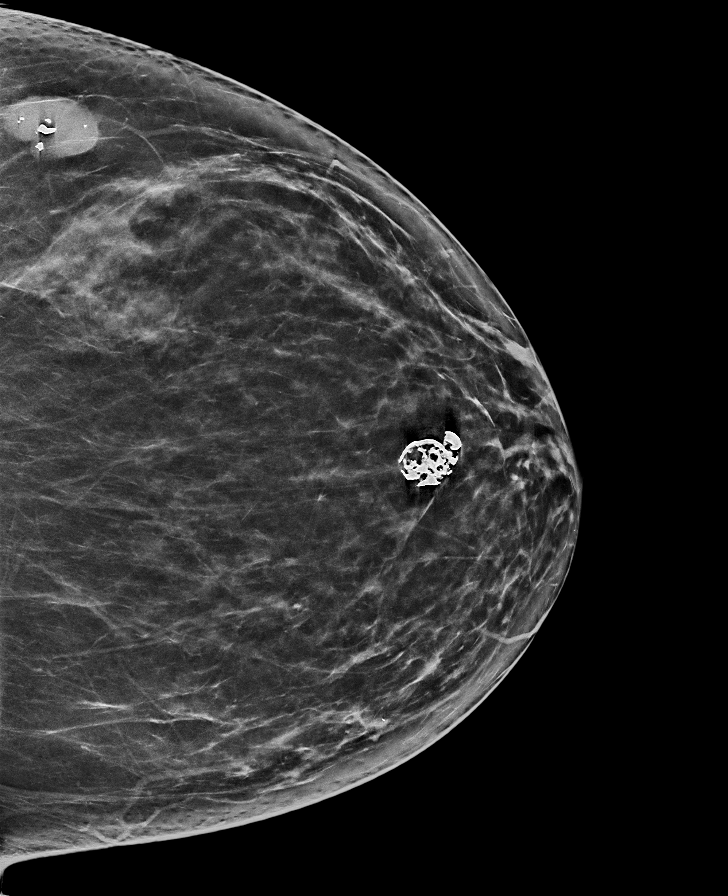

[L MLO tomo · tomo slice 49/98.0]
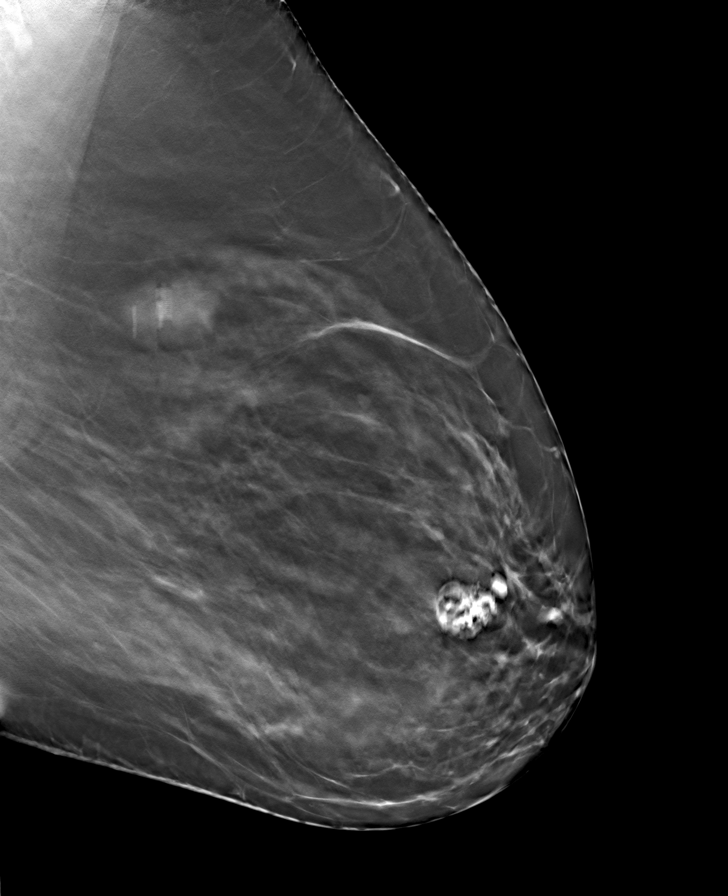

[R MLO tomo · tomo slice 47/93.0]
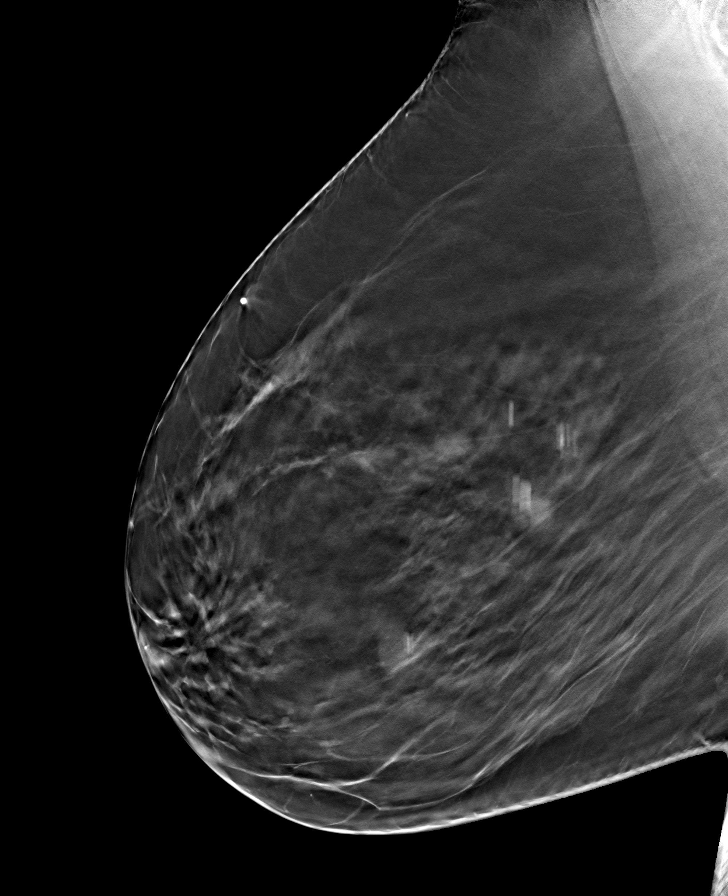

[R CC tomo · tomo slice 41/80.0]
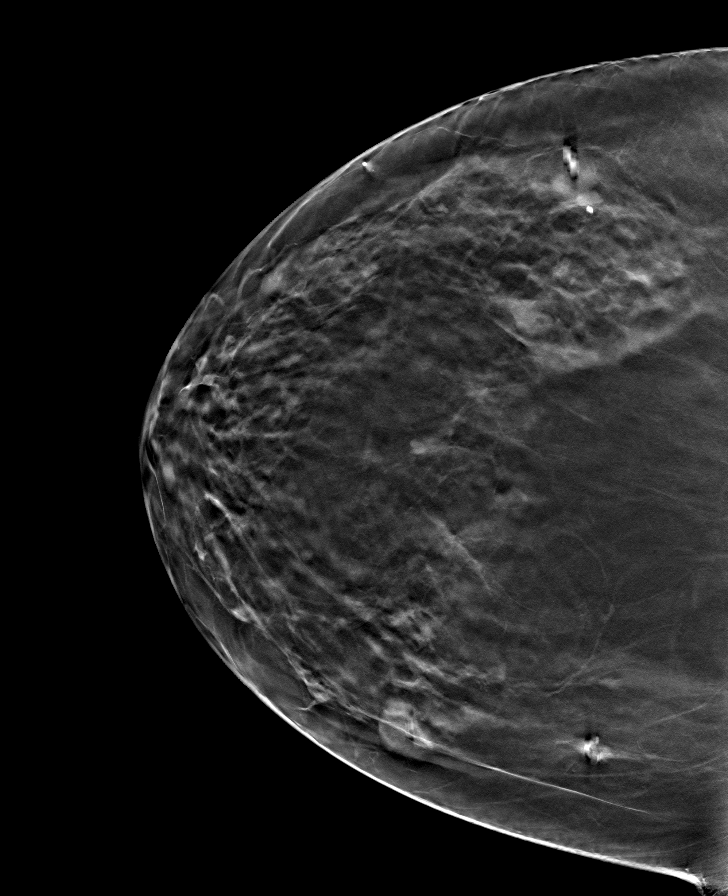

[L CC tomo · tomo slice 41/82.0]
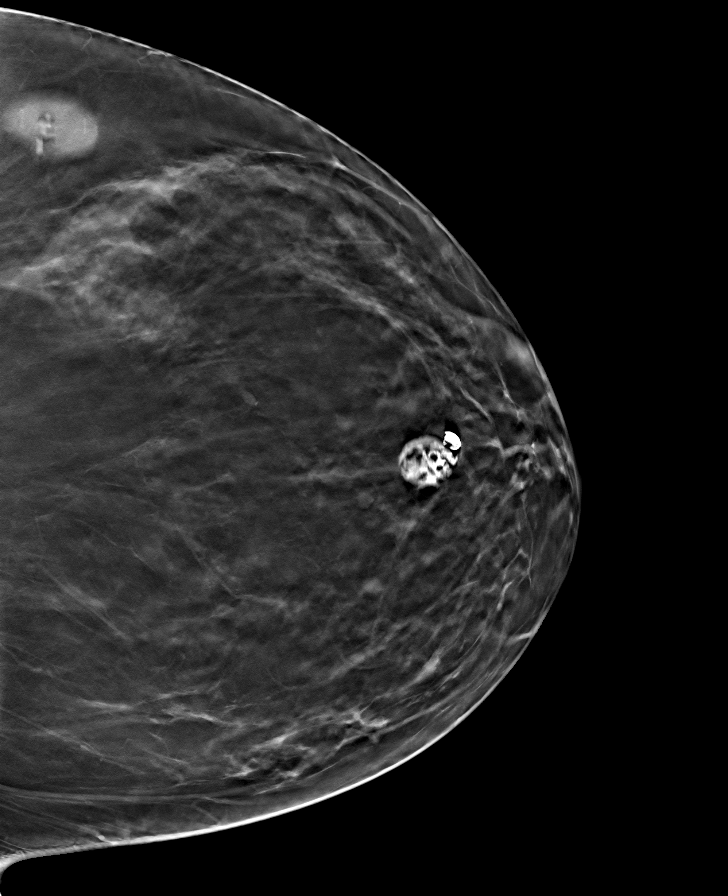

[8 of 24 positions shown; findings below may reference images not displayed]

ACR Breast Density Category b: There are scattered areas of
fibroglandular density.
FINDINGS: In the right breast an asymmetry requires further evaluation.

In the left breast a mass requires further evaluation.
IMPRESSION: Further evaluation is suggested for possible asymmetry in the right
breast.

Further evaluation is suggested for possible mass in the left
breast.

RECOMMENDATION:
Diagnostic mammogram and possibly ultrasound of both breasts.
(Code:[id number])

The patient will be contacted regarding the findings, and additional
imaging will be scheduled.

BI-RADS CATEGORY  0: Incomplete. Need additional imaging evaluation
and/or prior mammograms for comparison.

## 2023-01-05 IMAGING — US US BREAST*L* LIMITED INC AXILLA
1 series · 6 of 6 positions shown · non-contrast
Comparison: Previous exam(s).

CLINICAL DATA: Recall for possible asymmetry in the right breast
and possible mass of the left breast.

EXAM:
DIGITAL DIAGNOSTIC BILATERAL MAMMOGRAM WITH TOMOSYNTHESIS AND CAD;
ULTRASOUND LEFT BREAST LIMITED
TECHNIQUE: Bilateral digital diagnostic mammography and breast tomosynthesis
was performed. The images were evaluated with computer-aided
detection.; Targeted ultrasound examination of the left breast was
performed.

[Series 1: us breast*left* limited inc axilla · 0.06mm/px · 6 of 6 slices shown]
[im 1/6]
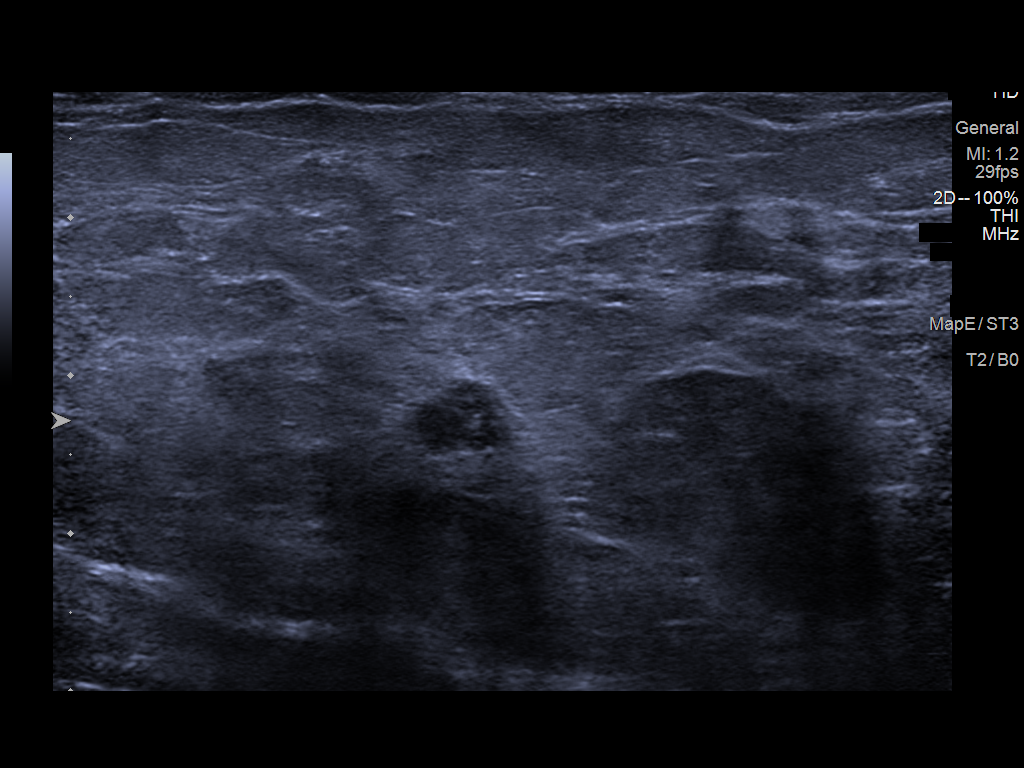
[im 2/6]
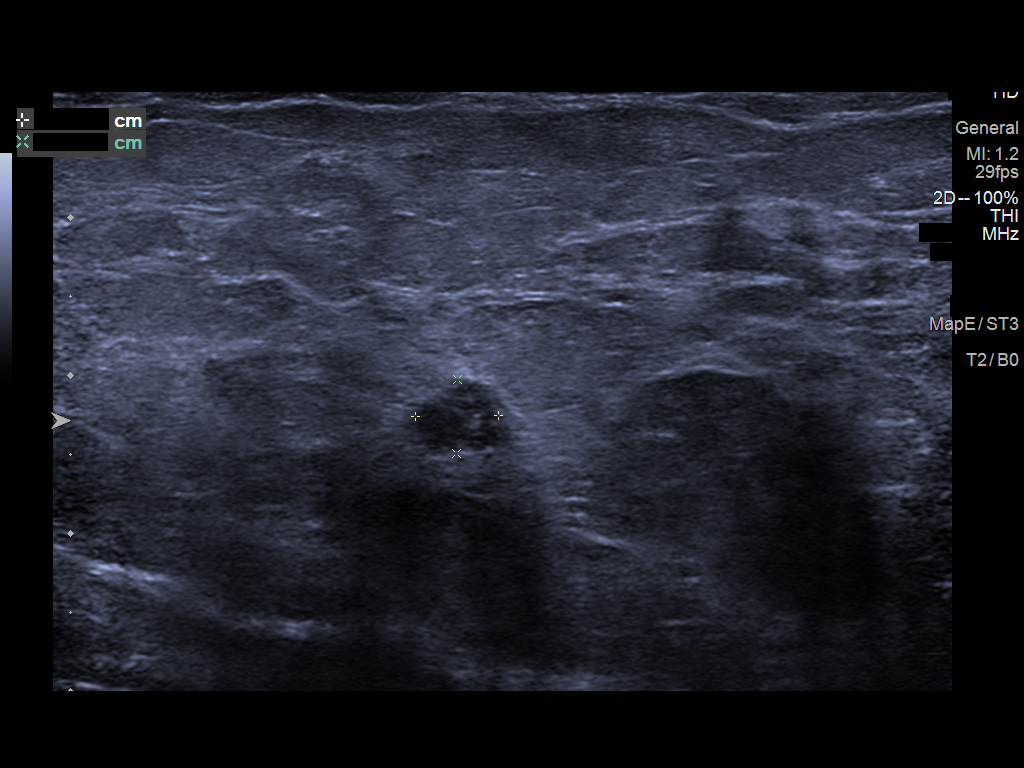
[im 3/6]
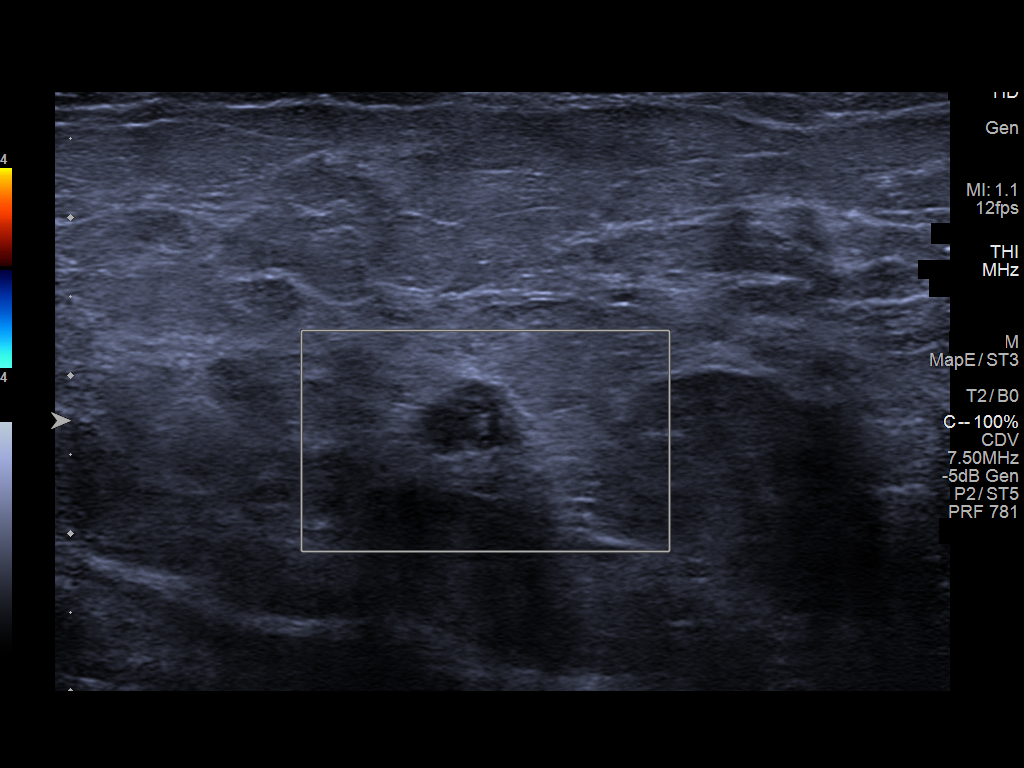
[im 4/6]
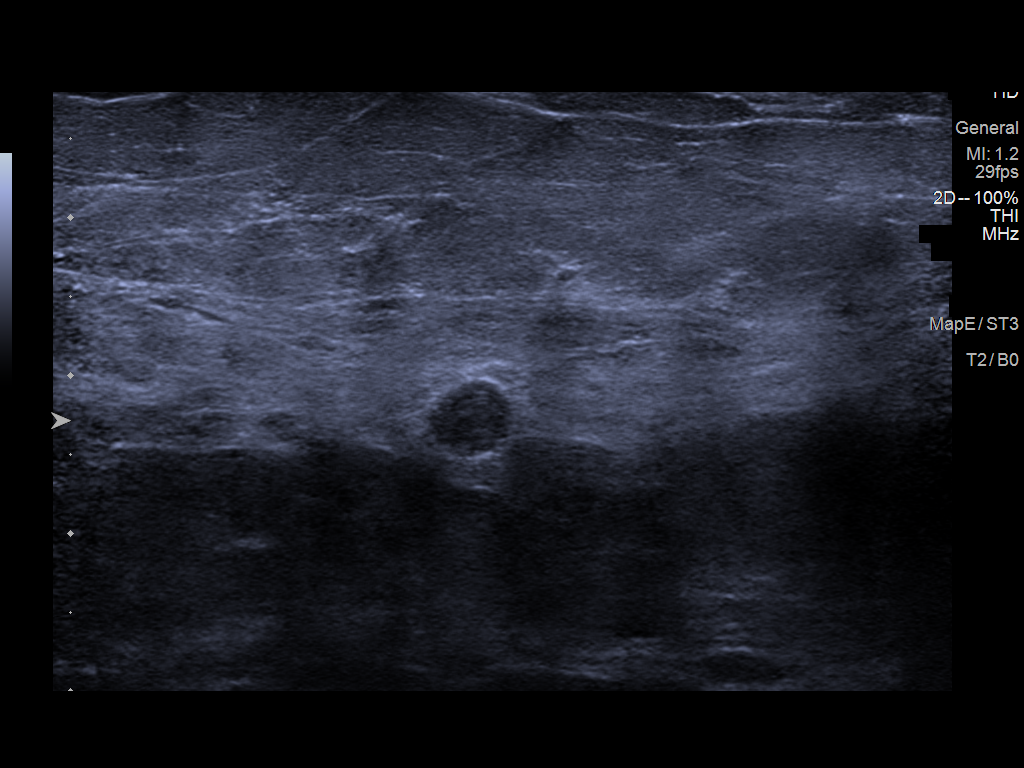
[im 5/6]
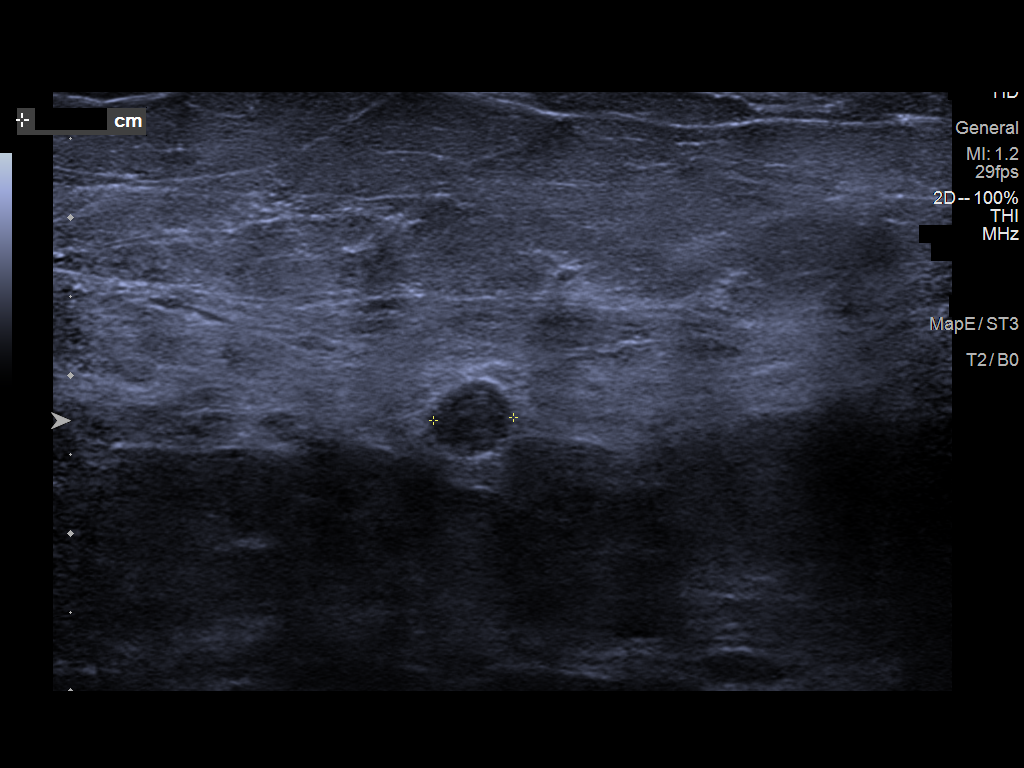
[im 6/6]
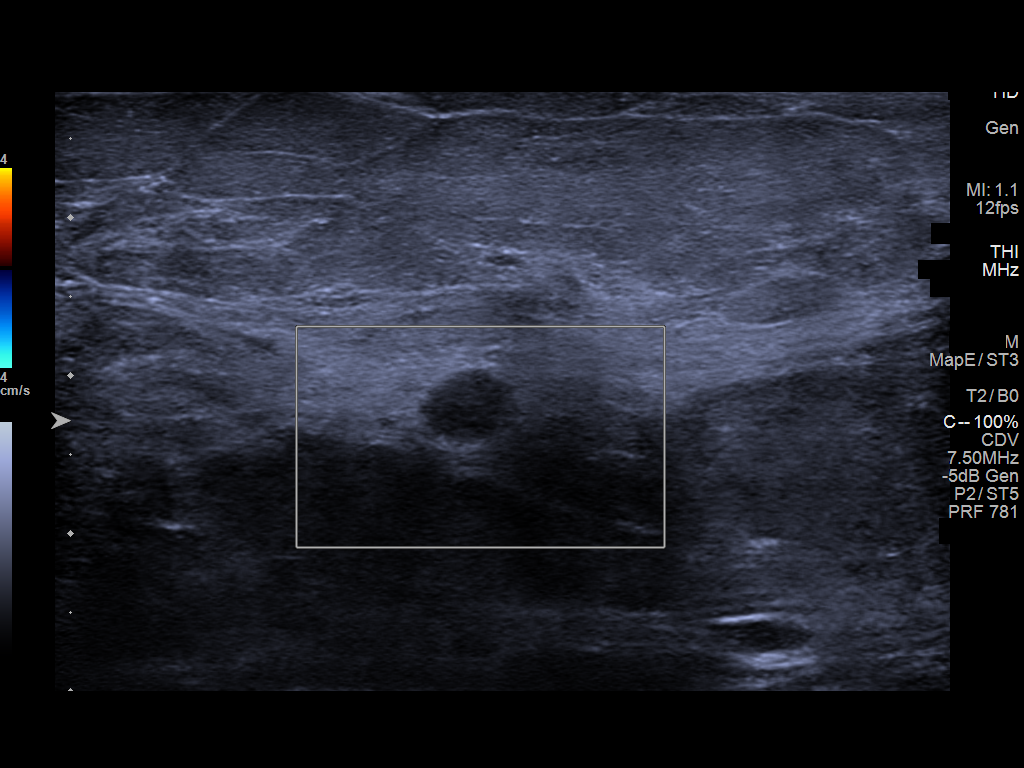

[6 of 6 positions shown; findings below may reference images not displayed]

ACR Breast Density Category c: The breast tissue is heterogeneously
dense, which may obscure small masses.
FINDINGS: RIGHT breast: The previously noted possible asymmetry in the central
right breast seen on CC view only dissipates on additional views,
consistent with overlapping fibroglandular tissue. No suspicious
findings on additional imaging.

LEFT breast: The previously noted possible mass in the left breast
persists on additional views as a 0.5 cm circumscribed round mass in
the upper inner left breast at anterior to middle depth.

Targeted ultrasound of the left breast at [DATE] position 4 cm from
the nipple demonstrates a 0.5 x 0.5 x 0.5 cm nearly anechoic
circumscribed round mass, felt to correspond to the mammographic
finding. No internal vascularity.
IMPRESSION: LEFT breast: A 0.5 cm mass at the left [DATE] position is favored to
correspond to the finding on recent screening mammogram, possibly
representing a complicated cyst. Recommend ultrasound-guided
aspiration with possible conversion to biopsy for definitive
characterization.

RIGHT breast: No mammographic findings of malignancy in the right
breast.

RECOMMENDATION:
Left breast ultrasound-guided aspiration with possible conversion to
biopsy. We will contact the patient's clinical provider to obtain an
order for the procedure and schedule the patient at her earliest
convenience

I have discussed the findings and recommendations with the patient.
If applicable, a reminder letter will be sent to the patient
regarding the next appointment.

BI-RADS CATEGORY  4: Suspicious.

## 2023-04-30 ENCOUNTER — Other Ambulatory Visit: Payer: Self-pay | Admitting: Surgery

## 2023-04-30 ENCOUNTER — Encounter: Payer: Self-pay | Admitting: Surgery

## 2023-04-30 DIAGNOSIS — Z853 Personal history of malignant neoplasm of breast: Secondary | ICD-10-CM

## 2023-04-30 DIAGNOSIS — N63 Unspecified lump in unspecified breast: Secondary | ICD-10-CM

## 2023-04-30 DIAGNOSIS — R928 Other abnormal and inconclusive findings on diagnostic imaging of breast: Secondary | ICD-10-CM

## 2023-04-30 DIAGNOSIS — Z1231 Encounter for screening mammogram for malignant neoplasm of breast: Secondary | ICD-10-CM

## 2023-06-25 ENCOUNTER — Ambulatory Visit
Admission: RE | Admit: 2023-06-25 | Discharge: 2023-06-25 | Disposition: A | Discharge status: Home / Self Care | Source: Ambulatory Visit | Attending: Surgery | Admitting: Surgery

## 2023-06-25 DIAGNOSIS — Z1231 Encounter for screening mammogram for malignant neoplasm of breast: Secondary | ICD-10-CM | POA: Insufficient documentation

## 2023-06-25 DIAGNOSIS — N63 Unspecified lump in unspecified breast: Secondary | ICD-10-CM | POA: Diagnosis present

## 2023-06-25 DIAGNOSIS — R928 Other abnormal and inconclusive findings on diagnostic imaging of breast: Secondary | ICD-10-CM | POA: Insufficient documentation
